# Patient Record
Sex: Female | Born: 1985 | Race: Asian | Hispanic: No | Marital: Single | Smoking: Never smoker
Health system: Southern US, Community
[De-identification: ages and names within clinical notes are randomized; demographics above are authoritative.]

## PROBLEM LIST (undated history)

## (undated) ENCOUNTER — Inpatient Hospital Stay (HOSPITAL_COMMUNITY): Payer: Self-pay

## (undated) DIAGNOSIS — O24419 Gestational diabetes mellitus in pregnancy, unspecified control: Secondary | ICD-10-CM

## (undated) HISTORY — PX: TONSILLECTOMY: SUR1361

---

## 2007-04-06 ENCOUNTER — Ambulatory Visit: Payer: Self-pay | Admitting: Nurse Practitioner

## 2007-04-06 DIAGNOSIS — J029 Acute pharyngitis, unspecified: Secondary | ICD-10-CM

## 2007-04-06 DIAGNOSIS — L259 Unspecified contact dermatitis, unspecified cause: Secondary | ICD-10-CM

## 2007-04-07 ENCOUNTER — Emergency Department (HOSPITAL_COMMUNITY): Admission: EM | Admit: 2007-04-07 | Discharge: 2007-04-07 | Payer: Self-pay | Admitting: Emergency Medicine

## 2007-04-07 ENCOUNTER — Encounter (INDEPENDENT_AMBULATORY_CARE_PROVIDER_SITE_OTHER): Payer: Self-pay | Admitting: Nurse Practitioner

## 2007-05-25 ENCOUNTER — Ambulatory Visit (HOSPITAL_BASED_OUTPATIENT_CLINIC_OR_DEPARTMENT_OTHER): Admission: RE | Admit: 2007-05-25 | Discharge: 2007-05-26 | Payer: Self-pay | Admitting: Otolaryngology

## 2007-05-25 ENCOUNTER — Encounter (INDEPENDENT_AMBULATORY_CARE_PROVIDER_SITE_OTHER): Payer: Self-pay | Admitting: Otolaryngology

## 2007-05-29 ENCOUNTER — Emergency Department (HOSPITAL_COMMUNITY): Admission: EM | Admit: 2007-05-29 | Discharge: 2007-05-29 | Payer: Self-pay | Admitting: Emergency Medicine

## 2010-03-23 ENCOUNTER — Ambulatory Visit
Admission: RE | Admit: 2010-03-23 | Discharge: 2010-03-23 | Payer: Self-pay | Source: Home / Self Care | Attending: Nurse Practitioner | Admitting: Nurse Practitioner

## 2010-03-23 ENCOUNTER — Encounter (INDEPENDENT_AMBULATORY_CARE_PROVIDER_SITE_OTHER): Payer: Self-pay | Admitting: Nurse Practitioner

## 2010-03-23 DIAGNOSIS — K219 Gastro-esophageal reflux disease without esophagitis: Secondary | ICD-10-CM | POA: Insufficient documentation

## 2010-03-23 DIAGNOSIS — L738 Other specified follicular disorders: Secondary | ICD-10-CM | POA: Insufficient documentation

## 2010-03-26 ENCOUNTER — Telehealth (INDEPENDENT_AMBULATORY_CARE_PROVIDER_SITE_OTHER): Payer: Self-pay | Admitting: Nurse Practitioner

## 2010-03-26 DIAGNOSIS — A048 Other specified bacterial intestinal infections: Secondary | ICD-10-CM | POA: Insufficient documentation

## 2010-03-26 LAB — CONVERTED CEMR LAB
AST: 24 units/L (ref 0–37)
Albumin: 4.9 g/dL (ref 3.5–5.2)
Alkaline Phosphatase: 58 units/L (ref 39–117)
Basophils Relative: 1 % (ref 0–1)
Eosinophils Absolute: 0.1 10*3/uL (ref 0.0–0.7)
Lymphs Abs: 1.4 10*3/uL (ref 0.7–4.0)
MCV: 88.9 fL (ref 78.0–100.0)
Neutro Abs: 2.7 10*3/uL (ref 1.7–7.7)
Neutrophils Relative %: 55 % (ref 43–77)
Platelets: 221 10*3/uL (ref 150–400)
Potassium: 4.8 meq/L (ref 3.5–5.3)
Sodium: 138 meq/L (ref 135–145)
Total Protein: 8.5 g/dL — ABNORMAL HIGH (ref 6.0–8.3)
WBC: 4.9 10*3/uL (ref 4.0–10.5)

## 2010-03-28 ENCOUNTER — Ambulatory Visit
Admission: RE | Admit: 2010-03-28 | Discharge: 2010-03-28 | Payer: Self-pay | Source: Home / Self Care | Attending: Nurse Practitioner | Admitting: Nurse Practitioner

## 2010-03-28 ENCOUNTER — Encounter (INDEPENDENT_AMBULATORY_CARE_PROVIDER_SITE_OTHER): Payer: Self-pay | Admitting: Nurse Practitioner

## 2010-04-03 ENCOUNTER — Telehealth (INDEPENDENT_AMBULATORY_CARE_PROVIDER_SITE_OTHER): Payer: Self-pay | Admitting: Nurse Practitioner

## 2010-04-05 NOTE — Progress Notes (Signed)
Summary: Medication concerns  Phone Note Call from Patient   Summary of Call:  PT SAYS SHE IS UNABLE TO KEEP THE MED  DOWN. EITHER RANITIDINE AND CEPHALEXIN.. SHE SAYS EVERY TIME SHE TAKES THE MED IT STOPS IN HER MIDDLE OF HER  BACK AND COMES BACK UP... PT SAYS SHE HAVE A WHITE  MUCUS THAT STOPS IN HER THROAT THAT MAKES HER VOMIT AND ITCHES HER THROAT.... PT SAYS EVERYTHING THAT SHE EATS DON'T GO TO HER STOMACH THAT IT JUST STOPS IN HER MIDDLE OF HER CHEST AND COMES BACK UP ABOUT FIVE OR TEN MINUTES... Initial call taken by: Armenia Shannon,  March 26, 2010 11:51 AM  Follow-up for Phone Call        schedule pt a f/u visit with provider to discuss labs done on last week.  She still needs to take meds as ordered on last week.  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

## 2010-04-05 NOTE — Assessment & Plan Note (Signed)
Summary: Acid Reflux   Vital Signs:  Patient profile:   25 year old female Weight:      97.5 pounds BMI:     19.76 Temp:     98.2 degrees F oral Pulse rate:   80 / minute Pulse rhythm:   regular Resp:     20 per minute BP sitting:   90 / 60  (left arm) Cuff size:   regular  Vitals Entered By: Levon Hedger (March 23, 2010 11:24 AM) CC: problems with digesting food...after eating at night she has alot of vomiting and discomfort x 1 week cause her heart to flutter...rash on whole body with itching , Abdominal Pain Is Patient Diabetic? No Pain Assessment Patient in pain? yes     Location: chest  Does patient need assistance? Functional Status Self care Ambulation Normal   CC:  problems with digesting food...after eating at night she has alot of vomiting and discomfort x 1 week cause her heart to flutter...rash on whole body with itching  and Abdominal Pain.  History of Present Illness:  Pt into the office with several complaints.  she has not been seen in this office since 2009  Abdominal problems -  Eating foods make problem worse +vomiting +burning midsternal - mostly present at night when she goes to bed -tea in the past 2 weeks -ETOH -stopped eating spicy foods 2 weeks ago +spagetti Reports that she took some of her family members medications without resolution of the symptoms  Rash - pt reports she was seen at the [address] 2 months ago for rash. She was prescribed 2 pills of which pt reports she only took one and it made her sick so she did not take the other. Rash present since 2010 - intermittent; chest, upper back and sometimes face affected +itching +"pimples" from time to  She has changed her soap about 1 month ago. Prior to that she was using [employer] and she did not notice any resolution of symptoms.  Social - pt is going to start working at [employer] on next week. Immunizations updated there 3 days ago. Pt speaks English  Dyspepsia  History:      The patient notes that the symptoms began approximately 03/05/2010.  She has no alarm features of dyspepsia including no history of melena, hematochezia, dysphagia, persistent vomiting, or involuntary weight loss > 5%.  There is no prior history of GERD.  The patient does not have a prior history of documented ulcer disease.  The dominant symptom is not heartburn or acid reflux.  An H-2 blocker medication is currently being taken.  No previous upper endoscopy has been done.    Allergies (verified): No Known Drug Allergies  Review of Systems General:  Denies fever. CV:  Denies chest pain or discomfort. Resp:  Denies cough. GI:  Complains of indigestion. Derm:  Complains of lesion(s).  Physical Exam  General:  alert.   Head:  normocephalic.   Msk:  normal ROM.   Neurologic:  alert & oriented X3.   Skin:  upper chest and back with papular lesion some erythematous and some healed Psych:  Oriented X3.     Impression & Recommendations:  Problem # 1:  ACID REFLUX DISEASE (ICD-530.81)  handout given with foods to monitor advised pt of dx Her updated medication list for this problem includes:    Ranitidine Hcl 150 Mg Tabs (Ranitidine hcl) ..... One tablet by mouth before breakfast and one tablet by mouth before dinner  Orders:  T-H Pylori Antibody IgM [phone]) T-Comprehensive Metabolic Panel [phone]) T-CBC w/Diff ([id number]) T-TSH ([id number])  Problem # 2:  FOLLICULITIS (ICD-704.8) advised pt of the dx handout given pt to use the three times per week to decrease symptoms  Complete Medication List: 1)  Ranitidine Hcl 150 Mg Tabs (Ranitidine hcl) .... One tablet by mouth before breakfast and one tablet by mouth before dinner 2)  Cephalexin 500 Mg Caps (Cephalexin) .... One capsule by mouth three times a day for infection 3)  Betasept Surgical Scrub 4 % Liqd (Chlorhexidine gluconate) .... Apply to affected area three times per week, let sit for 5 minutes  then rinse   Patient Instructions: 1)  Acid reflux - read handout to determine foods to avoid 2)  Take the medicine by mouth BEFORE breakfast and before dinner 3)  Skin problems - read the handout (Folliculitis) 4)  take the cephalexin 500mg  by mouth three times a day  5)  Use the wash and apply to chest and back three times per week. Let sit for 5 minutes then rinse  6)  Follow up as needed Prescriptions: BETASEPT SURGICAL SCRUB 4 % LIQD (CHLORHEXIDINE GLUCONATE) Apply to affected area three times per week, let sit for 5 minutes then rinse  #29ml x 0   Entered and Authorized by:   Lehman Prom FNP   Signed by:   Lehman Prom FNP on 03/23/2010   Method used:   Print then Give to Patient   RxID:   [id number] CEPHALEXIN 500 MG CAPS (CEPHALEXIN) One capsule by mouth three times a day for infection  #30 x 0   Entered and Authorized by:   Lehman Prom FNP   Signed by:   Lehman Prom FNP on 03/23/2010   Method used:   Print then Give to Patient   RxID:   [id number] RANITIDINE HCL 150 MG TABS (RANITIDINE HCL) One tablet by mouth before breakfast and one tablet by mouth before dinner  #60 x 3   Entered and Authorized by:   Lehman Prom FNP   Signed by:   Lehman Prom FNP on 03/23/2010   Method used:   Print then Give to Patient   RxID:   [id number]    Orders Added: 1)  Est. Patient Level III [06301] 2)  T-H Pylori Antibody IgM [60109-32355] 3)  T-Comprehensive Metabolic Panel [80053-22900] 4)  T-CBC w/Diff [[id number]] 5)  T-TSH [06237-62831]

## 2010-04-05 NOTE — Assessment & Plan Note (Signed)
Summary: Review labs   Vital Signs:  Patient profile:   25 year old female Weight:      97.0 pounds BMI:     19.66 Temp:     97.8 degrees F oral Pulse rate:   72 / minute Pulse rhythm:   regular Resp:     16 per minute BP sitting:   90 / 70  (left arm) Cuff size:   regular  Vitals Entered By: Levon Hedger (March 28, 2010 3:06 PM) CC: discuss medicine...discuss lab results. Is Patient Diabetic? No  Does patient need assistance? Functional Status Self care Ambulation Normal   CC:  discuss medicine...discuss lab results..  History of Present Illness:  Pt into the office for f/u on medications and labs. Pt was started on rantidine and keflex on last visit. Pt was not able to tolerate the keflex and so she stopped it. She has continued to take the acid reflux medication. Pt had difficulty swallowing the antibiotic pills so requests liquid with any further antibiotics  Labs indicate that pt has H.Pylori.  She does report intermittent problems with her stomach over the past 2 - 3 years.  Allergies (verified): No Known Drug Allergies  Review of Systems CV:  Denies chest pain or discomfort. Resp:  Denies cough. GI:  Complains of abdominal pain, indigestion, nausea, and vomiting; started once she started taking that antibiotic. MS:  Complains of cramps.  Physical Exam  General:  alert.   Head:  normocephalic.   Msk:  normal ROM.   Neurologic:  alert & oriented X3.     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 One tablet by mouth before breakfast and one tablet by mouth before dinner 2)  Betasept Surgical Scrub 4 % Liqd (Chlorhexidine gluconate) .... Apply to  affected area three times per week, let sit for 5 minutes then rinse 3)  Protonix 40 Mg Tbec (Pantoprazole sodium) .... One tablet by mouth two times a day for stomach 4)  Clarithromycin 250 Mg/16ml Susr (Clarithromycin) .... 10mg  by mouth two times a day for h. pylori 5)  Amoxicillin 250 Mg/46ml Susr (Amoxicillin) .... 4 teaspoons by mouth two times a day for infection  Patient Instructions: 1)  You have an infection in your stomach. 2)  Take all the medicatoins for 10 days.  I will send prescription for liquid medication to the pharmacy. 3)  You should get [facility] from [facility] so the cost will be lower. 4)  You can get burgers with cheese but avoid tomatos and onions. 5)  You can eat bananas. 6)  Avoid orange juice because it irriates your stomach. 7)  You can take a multivitamin by mouth daily (you can buy this over the counter) Prescriptions: AMOXICILLIN 250 MG/5ML SUSR (AMOXICILLIN) 4 teaspoons by mouth two times a day for infection  #512ml x 0   Entered and Authorized by:   Lehman Prom FNP   Signed by:   Lehman Prom FNP on 03/28/2010   Method used:   Faxed to ...       [facility] (retail)       [address] [place],  [place]  [address]       Ph: [phone]       Fax: [phone]   RxID:   [id number] CLARITHROMYCIN 250 MG/5ML SUSR (CLARITHROMYCIN) 10mg  by mouth two times a day for H. pylori  #317ml x 0   Entered and Authorized by:   Lehman Prom FNP   Signed by:   Lehman Prom FNP on 03/28/2010   Method used:   Faxed to ...       [facility] (retail)       [address] [place], [place]  [address]       Ph: [phone]       Fax: [phone]   RxID:   [id number] PROTONIX 40 MG TBEC (PANTOPRAZOLE SODIUM) One tablet by mouth two times a day for stomach  #28 x 0   Entered and Authorized by:   Lehman Prom FNP   Signed by:   Lehman Prom FNP on  03/28/2010   Method used:   Faxed to ...       [facility] (retail)       [address] [place], [place]  [address]       Ph: [phone]       Fax: [phone]   RxID:   [id number]    Orders Added: 1)  Est. Patient Level III [phone]

## 2010-04-05 NOTE — Letter (Signed)
Summary: Handout Printed  Printed Handout:  - Helicobacter Pylori Disease

## 2010-04-05 NOTE — Letter (Signed)
Summary: Handout Printed  Printed Handout:  - Folliculitis

## 2010-04-11 NOTE — Progress Notes (Signed)
Summary: Meds  Phone Note Call from Patient   Summary of Call: pt says she went to [facility] and the med (clarithromycin) is too high..... pt would like to go to [facility] and would like to get tablets Initial call taken by: [place] Shannon,  April 03, 2010 3:17 PM  Follow-up for Phone Call        I'm confused - I sent the Rx to [facility] pharmacy She has H.Pylori.  I have tried her on pills and she is not able to swallow Call [place] - find out what happened with  the meds.  If not, available could the oral suspension not be ordered Pt is NOT able to afford anywhere else that is why she has an orange card and was sent to [facility] pharmacy Follow-up by: Lehman Prom FNP,  April 04, 2010 1:23 PM  Additional Follow-up for Phone Call Additional follow up Details #1::        pt into the office today. She has agreed to take the pill form of the medication since she has not been able to afford the oral  - clarithromycin. Additional Follow-up by: Lehman Prom FNP,  April 04, 2010 3:39 PM    New/Updated Medications: CLARITHROMYCIN 500 MG TABS (CLARITHROMYCIN) One tablet by mouth two times a day for infection Prescriptions: CLARITHROMYCIN 500 MG TABS (CLARITHROMYCIN) One tablet by mouth two times a day for infection  #28 x 0   Entered and Authorized by:   Lehman Prom FNP   Signed by:   Lehman Prom FNP on 04/04/2010   Method used:   Faxed to ...       [facility] (retail)       [address] [place], [place]  [address]       Ph: [phone]       Fax: [phone]   RxID:   [id number]

## 2010-05-15 ENCOUNTER — Encounter: Payer: Self-pay | Admitting: Nurse Practitioner

## 2010-05-15 ENCOUNTER — Encounter (INDEPENDENT_AMBULATORY_CARE_PROVIDER_SITE_OTHER): Payer: Self-pay | Admitting: Nurse Practitioner

## 2010-05-15 DIAGNOSIS — K5900 Constipation, unspecified: Secondary | ICD-10-CM | POA: Insufficient documentation

## 2010-05-15 DIAGNOSIS — K649 Unspecified hemorrhoids: Secondary | ICD-10-CM | POA: Insufficient documentation

## 2010-05-22 NOTE — Letter (Signed)
Summary: Handout Printed  Printed Handout:  - Diet - High-Fiber 

## 2010-05-22 NOTE — Assessment & Plan Note (Signed)
Summary: Constipation   Vital Signs:  Patient profile:   25 year old female LMP:     05/06/2010 Weight:      99.5 pounds BMI:     20.17 Temp:     98.0 degrees F oral Pulse rate:   90 / minute Pulse rhythm:   regular Resp:     20 per minute BP sitting:   100 / 68  (left arm) Cuff size:   regular  Vitals Entered By: Levon Hedger (May 15, 2010 12:38 PM) CC: still having trouble with stomach she says that she has been out of stomach medicine for a month....constipation, Abdominal Pain Is Patient Diabetic? No Pain Assessment Patient in pain? yes     Location: stomach  Does patient need assistance? Functional Status Self care Ambulation Normal LMP (date): 05/06/2010     Enter LMP: 05/06/2010   CC:  still having trouble with stomach she says that she has been out of stomach medicine for a month....constipation and Abdominal Pain.  History of Present Illness:  Pt into the office for constipation Pt admits that she is not eating many high fiber foods  Pt completed her meds for H.Pylori as ordered.    Dyspepsia History:      She has no alarm features of dyspepsia including no history of melena, hematochezia, dysphagia, persistent vomiting, or involuntary weight loss > 5%.  There is a prior history of GERD.  The patient does not have a prior history of documented ulcer disease.  The dominant symptom is heartburn or acid reflux.  An H-2 blocker medication is currently being taken.  She notes that the symptoms have improved with the H-2 blocker therapy.  Symptoms have not persisted after 4 weeks of H-2 blocker treatment.  She has a history of a positive H. Pylori serology.  No previous upper endoscopy has been done.     Allergies (verified): No Known Drug Allergies  Review of Systems General:  Denies loss of appetite. CV:  Denies fatigue. Resp:  Denies cough. GI:  Complains of abdominal pain, bloody stools, constipation, hemorrhoids, and indigestion; +menstrual cramps  now. MS:  +pain present between shoulder blades.  Physical Exam  General:  alert.   Head:  normocephalic.   Rectal:  defer due to menses on currently Msk:  normal ROM.   Neurologic:  alert & oriented X3.   Skin:  color normal.   Psych:  Oriented X3.     Impression & Recommendations:  Problem # 1:  CONSTIPATION (ICD-564.00) handout given pt to start a stool softner and high fiber diet  Problem # 2:  HEMORRHOIDS (ICD-455.6) handout given  pt to get TUCKS OTC  Problem # 3:  ACID REFLUX DISEASE (ICD-530.81) will refill meds The following medications were removed from the medication list:    Protonix 40 Mg Tbec (Pantoprazole sodium) ..... One tablet by mouth two times a day for stomach Her updated medication list for this problem includes:    Ranitidine Hcl 150 Mg Tabs (Ranitidine hcl) ..... One tablet by mouth before breakfast and one tablet by mouth before dinner if abdominal problems persist will consider getting abdominal u/s  Complete Medication List: 1)  Ranitidine Hcl 150 Mg Tabs (Ranitidine hcl) .... One tablet by mouth before breakfast and one tablet by mouth before dinner 2)  Betasept Surgical Scrub 4 % Liqd (Chlorhexidine gluconate) .... Apply to affected area three times per week, let sit for 5 minutes then rinse  Dyspepsia Assessment/Plan:  Step Therapy: GERD  Treatment Protocols:    Step-1: started    H-2 blocker chosen: Ranitidine 150mg  by mouth at bedtime  Patient Instructions: 1)  Constipation - you need to be sure to keep your stools soft. 2)  you should eat raisins, oatmeal, raisin bran, apples with peeling. 3)  You can buy senokot over the counter and take daily to help soften stools 4)  Hemmorrhoids - this is what makes you bleed.  You should get TUCKS pads over the counter to help with bleeding and pain. 5)  Follow up as needed or if problem worsens. will also need rectal exam is problem persists Prescriptions: RANITIDINE HCL 150 MG TABS (RANITIDINE  HCL) One tablet by mouth before breakfast and one tablet by mouth before dinner  #60 x 11   Entered and Authorized by:   Lehman Prom FNP   Signed by:   Lehman Prom FNP on 05/15/2010   Method used:   Print then Give to Patient   RxID:   [id number]    Orders Added: 1)  Est. Patient Level III [95188]

## 2010-05-22 NOTE — Letter (Signed)
Summary: Handout Printed  Printed Handout:  - Hemorrhoids 

## 2010-07-17 NOTE — Op Note (Signed)
[name], [name]                 ACCOUNT NO.:  [id number]   MEDICAL RECORD NO.:  [id number]          PATIENT TYPE:  AMB   LOCATION:  DSC                          FACILITY:  [facility]   PHYSICIAN:  Karol T. Lazarus Salines, M.D. DATE OF BIRTH:  [birth year]   DATE OF PROCEDURE:  05/25/2007  DATE OF DISCHARGE:                               OPERATIVE REPORT   PREOPERATIVE DIAGNOSIS:  Recurrent tonsillitis   POSTOPERATIVE DIAGNOSIS:  Recurrent tonsillitis   PROCEDURE PERFORMED:  Tonsillectomy, adenoid ablation.   SURGEON:  Gloris Manchester. Lazarus Salines, M.D.   ANESTHESIA:  General orotracheal.   BLOOD LOSS:  Minimal.   COMPLICATIONS:  None.   FINDINGS:  2+ protruding tonsils.  Normal soft palate.  25% adenoid pad.  Clear anterior nose.   PROCEDURE:  With the patient in a comfortable supine position, general  orotracheal anesthesia was induced without difficulty.  At an  appropriate level, the table was turned 90 degrees and the patient  placed in Trendelenburg.  A clean preparation and draping was  accomplished.  Taking care to protect lips, teeth, and endotracheal  tube, the Crowe-Davis mouth gag was introduced, expanded for  visualization, and suspended from the Mayo stand in the standard  fashion.  The findings were as described above.  Palate retractor and  mirror were used to visualize the nasopharynx with the findings as  described above.  The anterior nose was inspected with the nasal  speculum with the findings as described above.  One-half percent  Xylocaine with 1:200,000 epinephrine, 8 mL total was infiltrated into  the peritonsillar planes for intraoperative hemostasis.  Several minutes  were allowed for this take effect.   A red rubber catheter was passed through the nose into the mouth and  brought out through the mouth to serve as a palate retractor.  Using  suction cautery and indirect visualization, the adenoid pad was ablated.  Hemostasis was achieved.  No further attention to the  nasopharynx was  required.  The catheter was relaxed.   Beginning on the left side, the tonsil was grasped and retracted  medially.  The mucosa overlying the anterior and superior poles was  coagulated and then cut down to the capsule of the tonsil.  Using the  cautery tip as a blunt dissector, lysing fibrous bands, and coagulating  crossing vessels as identified, the tonsil was dissected from its  muscular fossa from superiorly downward.  The tonsil was removed in its  entirety as determined by examination of both tonsil and fossa.  A small  additional quantity of cautery rendered the fossa hemostatic.  After  completing the left tonsillectomy, the right side was done in identical  fashion.   After removing both tonsils and rendering the oropharynx hemostatic, the  nasopharynx was inspected and again observed to be hemostatic.  At this  point the palate retractor and mouth gag were relaxed for several  minutes.  Upon re-expansion, hemostasis was persistent.  At this point  the procedure was completed.  The palate retractor and mouth gag were  relaxed and removed.  The dental status was intact.  The patient was  returned to Anesthesia, awakened, extubated, and transferred to recovery  in stable condition.   COMMENT:  A 25 year old [place] female immigrant with a history of  recurrent sore throats was the indication for today's procedure.  Anticipate routine postoperative recovery with attention to analgesia,  antibiosis, hydration, and observation for bleeding, airway compromise  or emesis.  Given low anticipated risk of postanesthetic or postsurgical  complications, will observe 6 hours and then she can be discharged home  if she is doing well.      Gloris Manchester. Lazarus Salines, M.D.  Electronically Signed     KTW/MEDQ  D:  05/25/2007  T:  05/25/2007  Job:  [id number]

## 2010-09-26 ENCOUNTER — Other Ambulatory Visit (HOSPITAL_COMMUNITY): Payer: Self-pay | Admitting: Family Medicine

## 2010-09-27 ENCOUNTER — Ambulatory Visit (HOSPITAL_COMMUNITY)
Admission: RE | Admit: 2010-09-27 | Discharge: 2010-09-27 | Disposition: A | Discharge status: Home / Self Care | Payer: Self-pay | Source: Ambulatory Visit | Attending: Family Medicine | Admitting: Family Medicine

## 2010-09-27 DIAGNOSIS — R51 Headache: Secondary | ICD-10-CM | POA: Insufficient documentation

## 2010-09-27 MED ORDER — IOHEXOL 300 MG/ML  SOLN
100.0000 mL | Freq: Once | INTRAMUSCULAR | Status: AC | PRN
  Administered 2010-09-27: 100 mL via INTRAVENOUS

## 2010-11-23 ENCOUNTER — Inpatient Hospital Stay (INDEPENDENT_AMBULATORY_CARE_PROVIDER_SITE_OTHER)
Admission: RE | Admit: 2010-11-23 | Discharge: 2010-11-23 | Disposition: A | Discharge status: Home / Self Care | Payer: Self-pay | Source: Ambulatory Visit | Attending: Emergency Medicine | Admitting: Emergency Medicine

## 2010-11-23 DIAGNOSIS — R300 Dysuria: Secondary | ICD-10-CM

## 2010-11-23 DIAGNOSIS — M545 Low back pain: Secondary | ICD-10-CM

## 2010-11-23 DIAGNOSIS — N912 Amenorrhea, unspecified: Secondary | ICD-10-CM

## 2010-11-23 LAB — WET PREP, GENITAL

## 2010-11-23 LAB — POCT URINALYSIS DIP (DEVICE)
Protein, ur: NEGATIVE mg/dL
Specific Gravity, Urine: 1.015 (ref 1.005–1.030)
Urobilinogen, UA: 0.2 mg/dL (ref 0.0–1.0)
pH: 6.5 (ref 5.0–8.0)

## 2010-11-24 LAB — GC/CHLAMYDIA PROBE AMP, GENITAL: GC Probe Amp, Genital: NEGATIVE

## 2010-11-26 LAB — POCT I-STAT, CHEM 8
BUN: 9
Calcium, Ion: 1.1 — ABNORMAL LOW
Chloride: 101
Creatinine, Ser: 0.7
Glucose, Bld: 88
HCT: 42
Hemoglobin: 14.3
Potassium: 4.1
Sodium: 135
TCO2: 26

## 2010-11-26 LAB — CBC
MCV: 89.5
Platelets: 226
WBC: 9.5

## 2010-11-26 LAB — DIFFERENTIAL
Eosinophils Absolute: 0.1
Lymphocytes Relative: 10 — ABNORMAL LOW
Lymphs Abs: 0.9
Neutrophils Relative %: 84 — ABNORMAL HIGH

## 2010-11-26 LAB — POCT HEMOGLOBIN-HEMACUE: Hemoglobin: 16 — ABNORMAL HIGH

## 2010-12-20 ENCOUNTER — Encounter (HOSPITAL_COMMUNITY): Payer: Self-pay | Admitting: *Deleted

## 2010-12-20 ENCOUNTER — Inpatient Hospital Stay (HOSPITAL_COMMUNITY)
Admission: AD | Admit: 2010-12-20 | Discharge: 2010-12-20 | Disposition: A | Discharge status: Home / Self Care | Payer: Self-pay | Source: Ambulatory Visit | Attending: Family Medicine | Admitting: Family Medicine

## 2010-12-20 DIAGNOSIS — N949 Unspecified condition associated with female genital organs and menstrual cycle: Secondary | ICD-10-CM

## 2010-12-20 DIAGNOSIS — N912 Amenorrhea, unspecified: Secondary | ICD-10-CM | POA: Insufficient documentation

## 2010-12-20 DIAGNOSIS — N910 Primary amenorrhea: Secondary | ICD-10-CM

## 2010-12-20 LAB — URINALYSIS, ROUTINE W REFLEX MICROSCOPIC
Hgb urine dipstick: NEGATIVE
Leukocytes, UA: NEGATIVE
Nitrite: NEGATIVE
Specific Gravity, Urine: >1.03 — ABNORMAL HIGH (ref 1.005–1.030)
Urobilinogen, UA: 0.2 mg/dL (ref 0.0–1.0)

## 2010-12-20 LAB — POCT PREGNANCY, URINE: Preg Test, Ur: NEGATIVE

## 2010-12-20 MED ORDER — MEDROXYPROGESTERONE ACETATE 10 MG PO TABS: 10.0000 mg | ORAL_TABLET | Freq: Every day | ORAL | Status: DC

## 2010-12-20 NOTE — ED Provider Notes (Signed)
History   Pt presents today c/o no menses for the past 3 months. She denies abd pain, vag dc, bleeding, fever or any other problems at this time.   Chief Complaint  Patient presents with  . Possible Pregnancy   HPI  OB History    Grav Para Term Preterm Abortions TAB SAB Ect Mult Living   1    1  1          Past Medical History  Diagnosis Date  . Migraine     Past Surgical History  Procedure Date  . Tonsillectomy     No family history on file.  History  Substance Use Topics  . Smoking status: Never Smoker   . Smokeless tobacco: Not on file  . Alcohol Use: No    Allergies: No Known Allergies  Prescriptions prior to admission  Medication Sig Dispense Refill  . ibuprofen (ADVIL,MOTRIN) 200 MG tablet Take 200 mg by mouth daily as needed. For pain         Review of Systems  Constitutional: Negative for fever.  Cardiovascular: Negative for chest pain.  Gastrointestinal: Negative for nausea, vomiting, abdominal pain, diarrhea and constipation.  Genitourinary: Negative for dysuria, urgency, frequency and hematuria.  Neurological: Negative for dizziness and headaches.  Psychiatric/Behavioral: Negative for depression and suicidal ideas.   Physical Exam   Blood pressure 113/76, pulse 81, temperature 98.2 F (36.8 C), temperature source Oral, resp. rate 18, height 5\' 1"  (1.549 m), weight 107 lb (48.535 kg).  Physical Exam  Nursing note and vitals reviewed. Constitutional: She is oriented to person, place, and time. She appears well-developed and well-nourished. No distress.  HENT:  Head: Normocephalic and atraumatic.  Eyes: EOM are normal. Pupils are equal, round, and reactive to light.  GI: Soft. She exhibits no distension and no mass. There is no tenderness. There is no rebound and no guarding.  Genitourinary: No bleeding around the vagina. No vaginal discharge found.       Cervix Lg/closed. Uterus NL size and shape. No adnexal masses.  Neurological: She is alert  and oriented to person, place, and time.  Skin: Skin is warm and dry. She is not diaphoretic.  Psychiatric: She has a normal mood and affect. Her behavior is normal. Judgment and thought content normal.    MAU Course  Procedures  Results for orders placed during the hospital encounter of 12/20/10 (from the past 24 hour(s))  URINALYSIS, ROUTINE W REFLEX MICROSCOPIC     Status: Abnormal   Collection Time   12/20/10 11:05 AM      Component Value Range   Color, Urine YELLOW  YELLOW    Appearance CLEAR  CLEAR    Specific Gravity, Urine >1.030 (*) 1.005 - 1.030    pH 5.5  5.0 - 8.0    Glucose, UA NEGATIVE  NEGATIVE (mg/dL)   Hgb urine dipstick NEGATIVE  NEGATIVE    Bilirubin Urine NEGATIVE  NEGATIVE    Ketones, ur NEGATIVE  NEGATIVE (mg/dL)   Protein, ur NEGATIVE  NEGATIVE (mg/dL)   Urobilinogen, UA 0.2  0.0 - 1.0 (mg/dL)   Nitrite NEGATIVE  NEGATIVE    Leukocytes, UA NEGATIVE  NEGATIVE   POCT PREGNANCY, URINE     Status: Normal   Collection Time   12/20/10 11:21 AM      Component Value Range   Preg Test, Ur NEGATIVE       Assessment and Plan  Delayed menses: will give Rx for provera. She will f/u with her PCP.  Discussed diet, activity, risks, and precautions.  Clinton Gallant. Maekayla Giorgio III, DrHSc, MPAS, PA-C  12/20/2010, 11:48 AM   Henrietta Hoover, PA 12/20/10 1151

## 2010-12-20 NOTE — ED Notes (Signed)
Pt has not had a period in 3 months; hx of irregular period; denies any pain; UPT negative;

## 2010-12-20 NOTE — Progress Notes (Signed)
No period  For 3 months.  Went to urgent care in Sept (21)- was told not preg.  Was told to go to [facility]- has appt  For 10/27.

## 2010-12-20 NOTE — ED Provider Notes (Signed)
Chart reviewed and agree with management and plan.  

## 2011-01-16 ENCOUNTER — Encounter: Payer: Self-pay | Admitting: Obstetrics and Gynecology

## 2011-09-09 ENCOUNTER — Emergency Department (HOSPITAL_COMMUNITY)
Admission: EM | Admit: 2011-09-09 | Discharge: 2011-09-09 | Disposition: A | Discharge status: Home / Self Care | Payer: 59 | Source: Home / Self Care | Attending: Family Medicine | Admitting: Family Medicine

## 2011-09-09 ENCOUNTER — Encounter (HOSPITAL_COMMUNITY): Payer: Self-pay

## 2011-09-09 ENCOUNTER — Emergency Department (INDEPENDENT_AMBULATORY_CARE_PROVIDER_SITE_OTHER): Payer: 59

## 2011-09-09 DIAGNOSIS — M79609 Pain in unspecified limb: Secondary | ICD-10-CM

## 2011-09-09 DIAGNOSIS — M79673 Pain in unspecified foot: Secondary | ICD-10-CM

## 2011-09-09 LAB — POCT URINALYSIS DIP (DEVICE)
Glucose, UA: NEGATIVE mg/dL
Leukocytes, UA: NEGATIVE
Nitrite: NEGATIVE
Specific Gravity, Urine: 1.02 (ref 1.005–1.030)
Urobilinogen, UA: 1 mg/dL (ref 0.0–1.0)

## 2011-09-09 LAB — POCT PREGNANCY, URINE: Preg Test, Ur: NEGATIVE

## 2011-09-09 NOTE — ED Provider Notes (Signed)
History     CSN: [id number]  Arrival date & time 09/09/11  1459   None     Chief Complaint  Patient presents with  . Foot Pain    (Consider location/radiation/quality/duration/timing/severity/associated sxs/prior treatment) Patient is a 26 y.o. female presenting with foot injury. The history is provided by the patient. No language interpreter was used.  Foot Injury  The incident occurred 12 to 24 hours ago. The incident occurred at home. There was no injury mechanism. The pain is present in the left foot. The pain is at a severity of 4/10. The pain is moderate. She reports no foreign bodies present. She has tried nothing for the symptoms.  Pt complains that she stepped on something. Pt thinks she has a foreign body in her foot.  Pt also reports she has burning when he urinates  Past Medical History  Diagnosis Date  . Migraine     Past Surgical History  Procedure Date  . Tonsillectomy     History reviewed. No pertinent family history.  History  Substance Use Topics  . Smoking status: Never Smoker   . Smokeless tobacco: Not on file  . Alcohol Use: No    OB History    Grav Para Term Preterm Abortions TAB SAB Ect Mult Living   1    1  1          Review of Systems  Skin: Negative for wound.  All other systems reviewed and are negative.    Allergies  Review of patient's allergies indicates no known allergies.  Home Medications   Current Outpatient Rx  Name Route Sig Dispense Refill  . IBUPROFEN 200 MG PO TABS Oral Take 200 mg by mouth daily as needed. For pain     . MEDROXYPROGESTERONE ACETATE 10 MG PO TABS Oral Take 1 tablet (10 mg total) by mouth daily. 10 tablet 0    BP 121/76  Pulse 85  Temp 98.6 F (37 C) (Oral)  Resp 16  SpO2 98%  Physical Exam  Nursing note and vitals reviewed. Constitutional: She appears well-developed and well-nourished.  HENT:  Head: Normocephalic and atraumatic.  Neck: Normal range of motion. Neck supple.  Cardiovascular:  Normal rate and regular rhythm.   Pulmonary/Chest: Effort normal and breath sounds normal.  Abdominal: Soft.  Musculoskeletal: She exhibits tenderness.  Neurological: She is alert.  Skin: Skin is warm.  Psychiatric: She has a normal mood and affect.    ED Course  Procedures (including critical care time)   Labs Reviewed  POCT URINALYSIS DIP (DEVICE)  POCT PREGNANCY, URINE   No results found.   No diagnosis found.    MDM  Urine is normal,  Xray no foreign body.   I will treat with ibuprofen,  I advised soak foot.  Follow up with Dr. Lajoyce Corners if foreign body senstaion persist.        Lonia Skinner [place], [place] 09/09/11 [phone]

## 2011-09-09 NOTE — ED Notes (Signed)
C/o 2 weeks pain w urination , denies vaginal d/c; also concern that she may have a FB in plantar surface of her foot, thinks she may ave stepped on something

## 2011-10-11 NOTE — ED Provider Notes (Signed)
Medical screening examination/treatment/procedure(s) were performed by resident physician or non-physician practitioner and as supervising physician I was immediately available for consultation/collaboration.   KINDL,JAMES DOUGLAS MD.    James D Kindl, MD 10/11/11 1600 

## 2012-01-17 ENCOUNTER — Emergency Department (HOSPITAL_COMMUNITY)
Admission: EM | Admit: 2012-01-17 | Discharge: 2012-01-17 | Disposition: A | Discharge status: Home / Self Care | Payer: 59 | Source: Home / Self Care | Attending: Family Medicine | Admitting: Family Medicine

## 2012-01-17 ENCOUNTER — Encounter (HOSPITAL_COMMUNITY): Payer: Self-pay

## 2012-01-17 DIAGNOSIS — J069 Acute upper respiratory infection, unspecified: Secondary | ICD-10-CM

## 2012-01-17 MED ORDER — SALINE NASAL SPRAY 0.65 % NA SOLN: 1.0000 | Freq: Three times a day (TID) | NASAL | Status: DC | PRN

## 2012-01-17 MED ORDER — FLUTICASONE PROPIONATE 50 MCG/ACT NA SUSP: 2.0000 | Freq: Every day | NASAL | Status: DC

## 2012-01-17 NOTE — ED Provider Notes (Signed)
History     CSN: [id number]  Arrival date & time 01/17/12  1409   First MD Initiated Contact with Patient 01/17/12 1700      Chief Complaint  Patient presents with  . Cough    (Consider location/radiation/quality/duration/timing/severity/associated sxs/prior treatment) Patient is a 26 y.o. female presenting with cough. The history is provided by the patient.  Cough This is a new problem. The current episode started yesterday. The problem occurs hourly. The cough is non-productive. There has been no fever. Associated symptoms include chest pain, chills, sweats, ear congestion, headaches, rhinorrhea, sore throat and myalgias. Treatments tried: tylenol and ibuprofen. The treatment provided no relief. She is not a smoker. Her past medical history does not include bronchitis, pneumonia or asthma.    Past Medical History  Diagnosis Date  . Migraine     Past Surgical History  Procedure Date  . Tonsillectomy     No family history on file.  History  Substance Use Topics  . Smoking status: Never Smoker   . Smokeless tobacco: Not on file  . Alcohol Use: No    OB History    Grav Para Term Preterm Abortions TAB SAB Ect Mult Living   1    1  1          Review of Systems  Constitutional: Positive for chills.  HENT: Positive for sore throat and rhinorrhea.   Respiratory: Positive for cough.   Cardiovascular: Positive for chest pain.  Musculoskeletal: Positive for myalgias.  Neurological: Positive for headaches.  All other systems reviewed and are negative.    Allergies  Review of patient's allergies indicates no known allergies.  Home Medications   Current Outpatient Rx  Name  Route  Sig  Dispense  Refill  . FLUTICASONE PROPIONATE 50 MCG/ACT NA SUSP   Nasal   Place 2 sprays into the nose daily.   16 g   2   . IBUPROFEN 200 MG PO TABS   Oral   Take 200 mg by mouth daily as needed. For pain          . MEDROXYPROGESTERONE ACETATE 10 MG PO TABS   Oral   Take 1  tablet (10 mg total) by mouth daily.   10 tablet   0   . SALINE NASAL SPRAY 0.65 % NA SOLN   Nasal   Place 1 spray into the nose 3 (three) times daily as needed for congestion.   30 mL   12     BP 118/82  Pulse 89  Temp 98.9 F (37.2 C) (Oral)  Resp 19  SpO2 100%  Physical Exam  Nursing note and vitals reviewed.   ED Course  Procedures (including critical care time)  Labs Reviewed - No data to display No results found.   1. URI (upper respiratory infection)       MDM  Increase fluid intake, rest.  No antibiotics indicated.  Begin expectorant/decongestant, topical decongestant, saline nasal spray and/or saline irrigation, and cough suppressant at bedtime. Antihistamines of your choice (Claritin or Zyrtec).  Tylenol or Motrin for fever/discomfort.  Followup with PCP if not improving 7 to 10 days.         Johnsie Kindred, NP 01/17/12 1725

## 2012-01-17 NOTE — ED Notes (Signed)
Patient states she has a fever (did not check but said she was warm last night) , dry cough and sore throat, states she has not tried any treatments

## 2012-01-17 NOTE — ED Provider Notes (Signed)
Medical screening examination/treatment/procedure(s) were performed by non-physician practitioner and as supervising physician I was immediately available for consultation/collaboration.  Leslee Home, M.D.   Reuben Likes, MD 01/17/12 2123

## 2012-08-25 ENCOUNTER — Other Ambulatory Visit: Payer: Self-pay | Admitting: Nurse Practitioner

## 2012-08-25 ENCOUNTER — Other Ambulatory Visit (HOSPITAL_COMMUNITY)
Admission: RE | Admit: 2012-08-25 | Discharge: 2012-08-25 | Disposition: A | Discharge status: Home / Self Care | Payer: 59 | Source: Ambulatory Visit | Attending: Obstetrics and Gynecology | Admitting: Obstetrics and Gynecology

## 2012-08-25 DIAGNOSIS — Z01419 Encounter for gynecological examination (general) (routine) without abnormal findings: Secondary | ICD-10-CM | POA: Insufficient documentation

## 2012-08-25 DIAGNOSIS — N760 Acute vaginitis: Secondary | ICD-10-CM | POA: Insufficient documentation

## 2012-08-25 DIAGNOSIS — Z113 Encounter for screening for infections with a predominantly sexual mode of transmission: Secondary | ICD-10-CM | POA: Insufficient documentation

## 2012-09-22 ENCOUNTER — Encounter (HOSPITAL_COMMUNITY): Payer: Self-pay

## 2012-09-22 ENCOUNTER — Emergency Department (HOSPITAL_COMMUNITY)
Admission: EM | Admit: 2012-09-22 | Discharge: 2012-09-23 | Disposition: A | Discharge status: Home / Self Care | Payer: 59 | Attending: Emergency Medicine | Admitting: Emergency Medicine

## 2012-09-22 DIAGNOSIS — K5289 Other specified noninfective gastroenteritis and colitis: Secondary | ICD-10-CM | POA: Insufficient documentation

## 2012-09-22 DIAGNOSIS — R509 Fever, unspecified: Secondary | ICD-10-CM | POA: Insufficient documentation

## 2012-09-22 DIAGNOSIS — R52 Pain, unspecified: Secondary | ICD-10-CM | POA: Insufficient documentation

## 2012-09-22 DIAGNOSIS — J029 Acute pharyngitis, unspecified: Secondary | ICD-10-CM | POA: Insufficient documentation

## 2012-09-22 DIAGNOSIS — K529 Noninfective gastroenteritis and colitis, unspecified: Secondary | ICD-10-CM

## 2012-09-22 DIAGNOSIS — Z8679 Personal history of other diseases of the circulatory system: Secondary | ICD-10-CM | POA: Insufficient documentation

## 2012-09-22 DIAGNOSIS — R51 Headache: Secondary | ICD-10-CM | POA: Insufficient documentation

## 2012-09-22 DIAGNOSIS — R112 Nausea with vomiting, unspecified: Secondary | ICD-10-CM | POA: Insufficient documentation

## 2012-09-22 DIAGNOSIS — R197 Diarrhea, unspecified: Secondary | ICD-10-CM | POA: Insufficient documentation

## 2012-09-22 NOTE — ED Notes (Signed)
Pt states she had a headache starting yesterday.  Today she has generalized body aches, sore throat, N/V/D.  Denies fever.

## 2012-09-23 LAB — CBC WITH DIFFERENTIAL/PLATELET
Eosinophils Relative: 1 % (ref 0–5)
Lymphocytes Relative: 14 % (ref 12–46)
Lymphs Abs: 1.2 10*3/uL (ref 0.7–4.0)
MCV: 86.7 fL (ref 78.0–100.0)
Neutro Abs: 7 10*3/uL (ref 1.7–7.7)
Neutrophils Relative %: 79 % — ABNORMAL HIGH (ref 43–77)
Platelets: 176 10*3/uL (ref 150–400)
RBC: 4.42 MIL/uL (ref 3.87–5.11)
WBC: 8.8 10*3/uL (ref 4.0–10.5)

## 2012-09-23 LAB — BASIC METABOLIC PANEL
CO2: 26 mEq/L (ref 19–32)
Chloride: 99 mEq/L (ref 96–112)
Glucose, Bld: 93 mg/dL (ref 70–99)
Potassium: 3.7 mEq/L (ref 3.5–5.1)
Sodium: 134 mEq/L — ABNORMAL LOW (ref 135–145)

## 2012-09-23 MED ORDER — LOPERAMIDE HCL 2 MG PO CAPS
4.0000 mg | ORAL_CAPSULE | Freq: Once | ORAL | Status: AC
  Administered 2012-09-23: 4 mg via ORAL
  Filled 2012-09-23: qty 2

## 2012-09-23 MED ORDER — KETOROLAC TROMETHAMINE 30 MG/ML IJ SOLN
30.0000 mg | Freq: Once | INTRAMUSCULAR | Status: AC
  Administered 2012-09-23: 30 mg via INTRAVENOUS
  Filled 2012-09-23: qty 1

## 2012-09-23 MED ORDER — DEXAMETHASONE SODIUM PHOSPHATE 10 MG/ML IJ SOLN
10.0000 mg | Freq: Once | INTRAMUSCULAR | Status: AC
  Administered 2012-09-23: 10 mg via INTRAVENOUS
  Filled 2012-09-23: qty 1

## 2012-09-23 MED ORDER — SODIUM CHLORIDE 0.9 % IV SOLN
1000.0000 mL | Freq: Once | INTRAVENOUS | Status: AC
  Administered 2012-09-23: 1000 mL via INTRAVENOUS

## 2012-09-23 MED ORDER — ONDANSETRON HCL 4 MG PO TABS: 4.0000 mg | ORAL_TABLET | Freq: Four times a day (QID) | ORAL | Status: DC | PRN

## 2012-09-23 MED ORDER — ONDANSETRON HCL 4 MG/2ML IJ SOLN
4.0000 mg | Freq: Once | INTRAMUSCULAR | Status: AC
  Administered 2012-09-23: 4 mg via INTRAVENOUS
  Filled 2012-09-23: qty 2

## 2012-09-23 MED ORDER — SODIUM CHLORIDE 0.9 % IV SOLN
1000.0000 mL | INTRAVENOUS | Status: DC
  Administered 2012-09-23: 1000 mL via INTRAVENOUS

## 2012-09-23 NOTE — ED Provider Notes (Signed)
History    CSN: [id number] Arrival date & time 09/22/12  2319  First MD Initiated Contact with Patient 09/23/12 0016     Chief Complaint  Patient presents with  . Generalized Body Aches  . Sore Throat  . Headache   (Consider location/radiation/quality/duration/timing/severity/associated sxs/prior Treatment) Patient is a 27 y.o. female presenting with pharyngitis and headaches. The history is provided by the patient.  Sore Throat Associated symptoms include headaches.  Headache She has had a mild headache for the last 3 days. Yesterday, she started developing a sore throat. Today, she has had nausea, vomiting, diarrhea. There's been subjective fever without chills or sweats. There's has been no arthralgias or myalgias. She's tried taking ibuprofen with no relief. She denies difficulty swallowing. She denies sick contacts. Symptoms are moderate to severe. Nothing makes it better nothing makes it worse. She rates her pain as moderate at 6/10. Past Medical History  Diagnosis Date  . Migraine    Past Surgical History  Procedure Laterality Date  . Tonsillectomy     No family history on file. History  Substance Use Topics  . Smoking status: Never Smoker   . Smokeless tobacco: Not on file  . Alcohol Use: No   OB History   Grav Para Term Preterm Abortions TAB SAB Ect Mult Living   1    1  1         Review of Systems  Neurological: Positive for headaches.  All other systems reviewed and are negative.    Allergies  Peanut-containing drug products  Home Medications   Current Outpatient Rx  Name  Route  Sig  Dispense  Refill  . PRESCRIPTION MEDICATION   Oral   Take 1 tablet by mouth every evening. Birth Control          BP 119/77  Pulse 99  Temp(Src) 98.5 F (36.9 C) (Oral)  Resp 18  SpO2 100%  LMP 08/29/2012 Physical Exam  Nursing note and vitals reviewed.  27 year old female, resting comfortably and in no acute distress. Vital signs are normal. Oxygen  saturation is 100%, which is normal. Head is normocephalic and atraumatic. PERRLA, EOMI. Oropharynx is clear. Neck is nontender and supple without adenopathy or JVD. Back is nontender and there is no CVA tenderness. Lungs are clear without rales, wheezes, or rhonchi. Chest is nontender. Heart has regular rate and rhythm without murmur. Abdomen is soft, flat, nontender without masses or hepatosplenomegaly and peristalsis is hypoactive. Extremities have no cyanosis or edema, full range of motion is present. Skin is warm and dry without rash. Neurologic: Mental status is normal, cranial nerves are intact, there are no motor or sensory deficits.  ED Course  Procedures (including critical care time) Results for orders placed during the hospital encounter of 09/22/12  RAPID STREP SCREEN      Result Value Range   Streptococcus, Group A Screen (Direct) NEGATIVE  NEGATIVE  CBC WITH DIFFERENTIAL      Result Value Range   WBC 8.8  4.0 - 10.5 K/uL   RBC 4.42  3.87 - 5.11 MIL/uL   Hemoglobin 13.5  12.0 - 15.0 g/dL   HCT 91.4  78.2 - 95.6 %   MCV 86.7  78.0 - 100.0 fL   MCH 30.5  26.0 - 34.0 pg   MCHC 35.2  30.0 - 36.0 g/dL   RDW 21.3  08.6 - 57.8 %   Platelets 176  150 - 400 K/uL   Neutrophils Relative % 79 (*) 43 -  77 %   Neutro Abs 7.0  1.7 - 7.7 K/uL   Lymphocytes Relative 14  12 - 46 %   Lymphs Abs 1.2  0.7 - 4.0 K/uL   Monocytes Relative 6  3 - 12 %   Monocytes Absolute 0.6  0.1 - 1.0 K/uL   Eosinophils Relative 1  0 - 5 %   Eosinophils Absolute 0.1  0.0 - 0.7 K/uL   Basophils Relative 0  0 - 1 %   Basophils Absolute 0.0  0.0 - 0.1 K/uL  BASIC METABOLIC PANEL      Result Value Range   Sodium 134 (*) 135 - 145 mEq/L   Potassium 3.7  3.5 - 5.1 mEq/L   Chloride 99  96 - 112 mEq/L   CO2 26  19 - 32 mEq/L   Glucose, Bld 93  70 - 99 mg/dL   BUN 14  6 - 23 mg/dL   Creatinine, Ser 4.09  0.50 - 1.10 mg/dL   Calcium 9.7  8.4 - 81.1 mg/dL   GFR calc non Af Amer >90  >90 mL/min   GFR  calc Af Amer >90  >90 mL/min   1. Gastroenteritis   2. Headache   3. Sore throat     MDM  Apparent viral syndrome with headache, sore throat, vomiting, diarrhea. She'll be given IV hydration, IV ondansetron, IV dexamethasone, IV ketorolac as well as oral loperamide. Strep screen will be obtained to rule out streptococcal pharyngitis.  Strep screen is negative. Laboratory workup is significant only for borderline hyponatremia. She feels much better after above noted treatment. She is discharged with prescription for ondansetron and she is advised to use over-the-counter loperamide as needed and over-the-counter ibuprofen and acetaminophen as needed.  Dione Booze, MD 09/23/12 [phone]

## 2012-09-24 LAB — CULTURE, GROUP A STREP

## 2012-10-25 ENCOUNTER — Encounter (HOSPITAL_COMMUNITY): Payer: Self-pay | Admitting: Emergency Medicine

## 2012-10-25 ENCOUNTER — Emergency Department (INDEPENDENT_AMBULATORY_CARE_PROVIDER_SITE_OTHER): Payer: 59

## 2012-10-25 ENCOUNTER — Emergency Department (HOSPITAL_COMMUNITY)
Admission: EM | Admit: 2012-10-25 | Discharge: 2012-10-25 | Disposition: A | Discharge status: Home / Self Care | Payer: 59 | Source: Home / Self Care | Attending: Emergency Medicine | Admitting: Emergency Medicine

## 2012-10-25 DIAGNOSIS — J40 Bronchitis, not specified as acute or chronic: Secondary | ICD-10-CM

## 2012-10-25 LAB — POCT RAPID STREP A: Streptococcus, Group A Screen (Direct): NEGATIVE

## 2012-10-25 MED ORDER — AZITHROMYCIN 250 MG PO TABS: ORAL_TABLET | ORAL | Status: DC

## 2012-10-25 NOTE — ED Provider Notes (Signed)
Medical screening examination/treatment/procedure(s) were performed by non-physician practitioner and as supervising physician I was immediately available for consultation/collaboration.  Leslee Home, M.D.  Reuben Likes, MD 10/25/12 [phone]

## 2012-10-25 NOTE — ED Notes (Signed)
Fever, sore throat x one week.  Reports frequent reoccurance of sore throats.  Reports drainage in back of throat.  Reports being seen in ed recently for the same.

## 2012-10-25 NOTE — ED Provider Notes (Signed)
  CSN: [id number]     Arrival date & time 10/25/12  1041 History     First MD Initiated Contact with Patient 10/25/12 1225     Chief Complaint  Patient presents with  . Sore Throat  . Fever   (Consider location/radiation/quality/duration/timing/severity/associated sxs/prior Treatment) Patient is a 27 y.o. female presenting with cough. The history is provided by the patient. No language interpreter was used.  Cough Cough characteristics:  Productive Sputum characteristics:  Clear Severity:  Moderate Onset quality:  Gradual Duration:  1 week Timing:  Constant Progression:  Worsening Chronicity:  New Smoker: no   Relieved by:  Nothing Worsened by:  Nothing tried Associated symptoms: sore throat   Associated symptoms: no fever    Pt complains of a sorethroat and cough Past Medical History  Diagnosis Date  . Migraine    Past Surgical History  Procedure Laterality Date  . Tonsillectomy     No family history on file. History  Substance Use Topics  . Smoking status: Never Smoker   . Smokeless tobacco: Not on file  . Alcohol Use: No   OB History   Grav Para Term Preterm Abortions TAB SAB Ect Mult Living   1    1  1         Review of Systems  Constitutional: Negative for fever.  HENT: Positive for sore throat.   Respiratory: Positive for cough.   All other systems reviewed and are negative.    Allergies  Peanut-containing drug products  Home Medications   Current Outpatient Rx  Name  Route  Sig  Dispense  Refill  . ibuprofen (ADVIL,MOTRIN) 200 MG tablet   Oral   Take 200 mg by mouth every 6 (six) hours as needed for pain.         Marland Kitchen ondansetron (ZOFRAN) 4 MG tablet   Oral   Take 1 tablet (4 mg total) by mouth every 6 (six) hours as needed for nausea.   20 tablet   0   . PRESCRIPTION MEDICATION   Oral   Take 1 tablet by mouth every evening. Birth Control          LMP 10/04/2012 Physical Exam  Nursing note and vitals reviewed. Constitutional: She  is oriented to person, place, and time. She appears well-developed and well-nourished.  HENT:  Head: Normocephalic.  Right Ear: External ear normal.  Left Ear: External ear normal.  Erythema throat,   Eyes: Conjunctivae are normal. Pupils are equal, round, and reactive to light.  Neck: Normal range of motion. Neck supple.  Cardiovascular: Normal rate.   Pulmonary/Chest: Effort normal.  Musculoskeletal: Normal range of motion.  Neurological: She is alert and oriented to person, place, and time.  Skin: Skin is warm.  Psychiatric: She has a normal mood and affect.    ED Course   Procedures (including critical care time)  Labs Reviewed  POCT RAPID STREP A ([facility] ONLY)   No results found. 1. Bronchitis     MDM  Zithromax,  Continue zofran  Elson Areas, PA-C 10/25/12 1348

## 2012-10-26 ENCOUNTER — Emergency Department (HOSPITAL_COMMUNITY)
Admission: EM | Admit: 2012-10-26 | Discharge: 2012-10-26 | Disposition: A | Discharge status: Home / Self Care | Payer: 59 | Attending: Emergency Medicine | Admitting: Emergency Medicine

## 2012-10-26 ENCOUNTER — Encounter (HOSPITAL_COMMUNITY): Payer: Self-pay | Admitting: Emergency Medicine

## 2012-10-26 DIAGNOSIS — R5381 Other malaise: Secondary | ICD-10-CM | POA: Insufficient documentation

## 2012-10-26 DIAGNOSIS — R1110 Vomiting, unspecified: Secondary | ICD-10-CM | POA: Insufficient documentation

## 2012-10-26 DIAGNOSIS — R079 Chest pain, unspecified: Secondary | ICD-10-CM | POA: Insufficient documentation

## 2012-10-26 DIAGNOSIS — R630 Anorexia: Secondary | ICD-10-CM | POA: Insufficient documentation

## 2012-10-26 DIAGNOSIS — J40 Bronchitis, not specified as acute or chronic: Secondary | ICD-10-CM

## 2012-10-26 DIAGNOSIS — R509 Fever, unspecified: Secondary | ICD-10-CM | POA: Insufficient documentation

## 2012-10-26 DIAGNOSIS — IMO0001 Reserved for inherently not codable concepts without codable children: Secondary | ICD-10-CM | POA: Insufficient documentation

## 2012-10-26 DIAGNOSIS — Z3202 Encounter for pregnancy test, result negative: Secondary | ICD-10-CM | POA: Insufficient documentation

## 2012-10-26 DIAGNOSIS — Z8669 Personal history of other diseases of the nervous system and sense organs: Secondary | ICD-10-CM | POA: Insufficient documentation

## 2012-10-26 DIAGNOSIS — J209 Acute bronchitis, unspecified: Secondary | ICD-10-CM | POA: Insufficient documentation

## 2012-10-26 LAB — CBC WITH DIFFERENTIAL/PLATELET
Basophils Relative: 0 % (ref 0–1)
Eosinophils Absolute: 0.1 10*3/uL (ref 0.0–0.7)
HCT: 41.1 % (ref 36.0–46.0)
Hemoglobin: 14.6 g/dL (ref 12.0–15.0)
Lymphs Abs: 1.3 10*3/uL (ref 0.7–4.0)
MCH: 30.5 pg (ref 26.0–34.0)
MCHC: 35.5 g/dL (ref 30.0–36.0)
Monocytes Absolute: 0.6 10*3/uL (ref 0.1–1.0)
Monocytes Relative: 11 % (ref 3–12)
RBC: 4.79 MIL/uL (ref 3.87–5.11)

## 2012-10-26 LAB — URINALYSIS, ROUTINE W REFLEX MICROSCOPIC
Glucose, UA: NEGATIVE mg/dL
Ketones, ur: NEGATIVE mg/dL
Protein, ur: NEGATIVE mg/dL
pH: 5.5 (ref 5.0–8.0)

## 2012-10-26 LAB — URINE MICROSCOPIC-ADD ON

## 2012-10-26 LAB — BASIC METABOLIC PANEL
CO2: 24 mEq/L (ref 19–32)
Calcium: 10.3 mg/dL (ref 8.4–10.5)
Chloride: 102 mEq/L (ref 96–112)
Glucose, Bld: 93 mg/dL (ref 70–99)
Sodium: 138 mEq/L (ref 135–145)

## 2012-10-26 MED ORDER — SODIUM CHLORIDE 0.9 % IV BOLUS (SEPSIS)
1000.0000 mL | Freq: Once | INTRAVENOUS | Status: AC
  Administered 2012-10-26: 1000 mL via INTRAVENOUS

## 2012-10-26 NOTE — ED Provider Notes (Signed)
CSN: [id number]     Arrival date & time 10/26/12  1011 History     First MD Initiated Contact with Patient 10/26/12 1200     Chief Complaint  Patient presents with  . Abdominal Pain  . Chest Pain   (Consider location/radiation/quality/duration/timing/severity/associated sxs/prior Treatment) Patient is a 27 y.o. female presenting with abdominal pain and chest pain. The history is provided by the patient. No language interpreter was used.  Abdominal Pain Pain location:  LUQ and RUQ Pain quality: aching   Worsened by:  Coughing Associated symptoms: chest pain, cough, fatigue, fever and vomiting   Associated symptoms: no chills, no diarrhea, no dysuria, no vaginal bleeding and no vaginal discharge   Associated symptoms comment:  She presents with productive, blood-tinged cough for several days, now with soreness in upper quadrant abdomen associated with cough. She had one episode of emesis 3 days ago secondary to cough. Three weeks ago she had a GI illness consisting of nausea and vomiting, generalized body aches, fatigue and subjective fever, but this illness resolved since that time. She also complains of persistent fatigue and intermittent generalized body aches for many months. This has been evaluated multiple times be her physician, she states, without answers or diagnoses.  Chest Pain Associated symptoms: abdominal pain, cough, fatigue, fever and vomiting     Past Medical History  Diagnosis Date  . Migraine    Past Surgical History  Procedure Laterality Date  . Tonsillectomy     No family history on file. History  Substance Use Topics  . Smoking status: Never Smoker   . Smokeless tobacco: Not on file  . Alcohol Use: No   OB History   Grav Para Term Preterm Abortions TAB SAB Ect Mult Living   1    1  1         Review of Systems  Constitutional: Positive for fever, appetite change and fatigue. Negative for chills and unexpected weight change.  HENT: Negative.    Respiratory: Positive for cough.   Cardiovascular: Positive for chest pain.  Gastrointestinal: Positive for vomiting and abdominal pain. Negative for diarrhea.  Genitourinary: Negative.  Negative for dysuria, vaginal bleeding and vaginal discharge.  Musculoskeletal: Positive for myalgias.  Skin: Negative.  Negative for rash.  Neurological: Negative.   Psychiatric/Behavioral: Negative.     Allergies  Peanut-containing drug products  Home Medications   Current Outpatient Rx  Name  Route  Sig  Dispense  Refill  . azithromycin (ZITHROMAX Z-PAK) 250 MG tablet      Two tablets first day then one a day   6 tablet   0   . ibuprofen (ADVIL,MOTRIN) 200 MG tablet   Oral   Take 200 mg by mouth every 6 (six) hours as needed for pain.         Marland Kitchen PRESCRIPTION MEDICATION   Oral   Take 1 tablet by mouth every evening. Birth Control          BP 96/68  Pulse 92  Temp(Src) 98.6 F (37 C)  Resp 16  SpO2 99%  LMP 10/04/2012 Physical Exam  Constitutional: She is oriented to person, place, and time. She appears well-developed and well-nourished.  HENT:  Head: Normocephalic.  Mouth/Throat: Oropharynx is clear and moist.  Neck: Normal range of motion. Neck supple.  Cardiovascular: Normal rate and regular rhythm.   Pulmonary/Chest: Effort normal and breath sounds normal. She has no wheezes. She has no rales.  Abdominal: Soft. Bowel sounds are normal. There is tenderness. There  is no rebound and no guarding.  Mild right and left upper quadrant tenderness.   Musculoskeletal: Normal range of motion. She exhibits no edema.  Neurological: She is alert and oriented to person, place, and time.  Skin: Skin is warm and dry. No rash noted.  Psychiatric: She has a normal mood and affect.    ED Course   Procedures (including critical care time)  Labs Reviewed  CBC WITH DIFFERENTIAL  BASIC METABOLIC PANEL  PREGNANCY, URINE  URINALYSIS, ROUTINE W REFLEX MICROSCOPIC   Results for orders  placed during the hospital encounter of 10/26/12  CBC WITH DIFFERENTIAL      Result Value Range   WBC 5.5  4.0 - 10.5 K/uL   RBC 4.79  3.87 - 5.11 MIL/uL   Hemoglobin 14.6  12.0 - 15.0 g/dL   HCT 16.1  09.6 - 04.5 %   MCV 85.8  78.0 - 100.0 fL   MCH 30.5  26.0 - 34.0 pg   MCHC 35.5  30.0 - 36.0 g/dL   RDW 40.9  81.1 - 91.4 %   Platelets 179  150 - 400 K/uL   Neutrophils Relative % 65  43 - 77 %   Neutro Abs 3.6  1.7 - 7.7 K/uL   Lymphocytes Relative 23  12 - 46 %   Lymphs Abs 1.3  0.7 - 4.0 K/uL   Monocytes Relative 11  3 - 12 %   Monocytes Absolute 0.6  0.1 - 1.0 K/uL   Eosinophils Relative 1  0 - 5 %   Eosinophils Absolute 0.1  0.0 - 0.7 K/uL   Basophils Relative 0  0 - 1 %   Basophils Absolute 0.0  0.0 - 0.1 K/uL  PREGNANCY, URINE      Result Value Range   Preg Test, Ur NEGATIVE  NEGATIVE  URINALYSIS, ROUTINE W REFLEX MICROSCOPIC      Result Value Range   Color, Urine YELLOW  YELLOW   APPearance CLOUDY (*) CLEAR   Specific Gravity, Urine 1.014  1.005 - 1.030   pH 5.5  5.0 - 8.0   Glucose, UA NEGATIVE  NEGATIVE mg/dL   Hgb urine dipstick TRACE (*) NEGATIVE   Bilirubin Urine NEGATIVE  NEGATIVE   Ketones, ur NEGATIVE  NEGATIVE mg/dL   Protein, ur NEGATIVE  NEGATIVE mg/dL   Urobilinogen, UA 0.2  0.0 - 1.0 mg/dL   Nitrite NEGATIVE  NEGATIVE   Leukocytes, UA MODERATE (*) NEGATIVE  BASIC METABOLIC PANEL      Result Value Range   Sodium 138  135 - 145 mEq/L   Potassium 4.1  3.5 - 5.1 mEq/L   Chloride 102  96 - 112 mEq/L   CO2 24  19 - 32 mEq/L   Glucose, Bld 93  70 - 99 mg/dL   BUN 9  6 - 23 mg/dL   Creatinine, Ser 7.82  0.50 - 1.10 mg/dL   Calcium 95.6  8.4 - 21.3 mg/dL   GFR calc non Af Amer >90  >90 mL/min   GFR calc Af Amer >90  >90 mL/min  URINE MICROSCOPIC-ADD ON      Result Value Range   Squamous Epithelial / LPF MANY (*) RARE   WBC, UA 7-10  <3 WBC/hpf   RBC / HPF 0-2  <3 RBC/hpf   Bacteria, UA MANY (*) RARE   Urine-Other MUCOUS PRESENT      Date:  10/26/2012  Rate: 86  Rhythm: normal sinus rhythm  QRS Axis: normal  Intervals:  PR shortened  ST/T Wave abnormalities: normal  Conduction Disutrbances:none  Narrative Interpretation:   Old EKG Reviewed: none available   Dg Chest 2 View  10/25/2012   *RADIOLOGY REPORT*  Clinical Data:  Cough for 2 days, chronic fever  CHEST - 2 VIEW  Comparison: None  Findings: Normal heart size and mediastinal contours. Peribronchial thickening and chronic accentuation perihilar markings again seen. No definite acute infiltrate, pleural effusion or pneumothorax. Bones unremarkable.  IMPRESSION: Chronic peribronchial thickening which can be seen with bronchitis or asthma. No acute infiltrate.   Original Report Authenticated By: Ulyses Southward, M.D.   No diagnosis found. 1. Bronchitis  MDM  She feels some better with IV fluids. She has no concerning lab studies and is on appropriate abx for diagnosis of bronchitis. Drinking PO fluids and appears NAD. Stable for discharge.   Arnoldo Hooker, PA-C 10/26/12 1401  Arnoldo Hooker, PA-C 10/26/12 1402

## 2012-10-26 NOTE — ED Notes (Signed)
staes that she was seen at [facility] yesterday for  N/v fever and cp dx w/ bronchititis states is not feeling better has h/a and cp now

## 2012-10-26 NOTE — ED Provider Notes (Signed)
Medical screening examination/treatment/procedure(s) were performed by non-physician practitioner and as supervising physician I was immediately available for consultation/collaboration.  Derwood Kaplan, MD 10/26/12 2053

## 2012-10-27 LAB — URINE CULTURE

## 2014-01-03 ENCOUNTER — Encounter (HOSPITAL_COMMUNITY): Payer: Self-pay | Admitting: Emergency Medicine

## 2015-03-05 HISTORY — PX: TOOTH EXTRACTION: SHX859

## 2015-03-14 ENCOUNTER — Other Ambulatory Visit (HOSPITAL_COMMUNITY): Payer: Self-pay | Admitting: Obstetrics & Gynecology

## 2015-03-14 DIAGNOSIS — N979 Female infertility, unspecified: Secondary | ICD-10-CM

## 2015-03-16 ENCOUNTER — Ambulatory Visit (HOSPITAL_COMMUNITY): Payer: 59

## 2015-04-26 ENCOUNTER — Encounter (INDEPENDENT_AMBULATORY_CARE_PROVIDER_SITE_OTHER): Payer: Self-pay

## 2015-04-26 ENCOUNTER — Ambulatory Visit (HOSPITAL_COMMUNITY)
Admission: RE | Admit: 2015-04-26 | Discharge: 2015-04-26 | Disposition: A | Discharge status: Home / Self Care | Payer: 59 | Source: Ambulatory Visit | Attending: Obstetrics & Gynecology | Admitting: Obstetrics & Gynecology

## 2015-04-26 DIAGNOSIS — N979 Female infertility, unspecified: Secondary | ICD-10-CM | POA: Diagnosis not present

## 2015-04-26 MED ORDER — IOHEXOL 300 MG/ML  SOLN
30.0000 mL | Freq: Once | INTRAMUSCULAR | Status: AC | PRN
  Administered 2015-04-26: 30 mL

## 2015-05-04 MED FILL — HYDROCODON-APAP 7.5-325: 7.5-325 | 4 days supply | Qty: 18 | Fill #0

## 2015-07-25 DIAGNOSIS — J069 Acute upper respiratory infection, unspecified: Secondary | ICD-10-CM | POA: Diagnosis not present

## 2015-07-25 MED FILL — CMPD-Prednisolone:Diphen: 2 days supply | Qty: 120 | Fill #0

## 2015-09-06 ENCOUNTER — Other Ambulatory Visit (HOSPITAL_COMMUNITY)
Admission: RE | Admit: 2015-09-06 | Discharge: 2015-09-06 | Disposition: A | Discharge status: Home / Self Care | Payer: 59 | Source: Ambulatory Visit | Attending: Obstetrics and Gynecology | Admitting: Obstetrics and Gynecology

## 2015-09-06 ENCOUNTER — Other Ambulatory Visit: Payer: Self-pay | Admitting: Obstetrics & Gynecology

## 2015-09-06 DIAGNOSIS — Z01419 Encounter for gynecological examination (general) (routine) without abnormal findings: Secondary | ICD-10-CM | POA: Diagnosis not present

## 2015-09-06 DIAGNOSIS — Z3169 Encounter for other general counseling and advice on procreation: Secondary | ICD-10-CM | POA: Diagnosis not present

## 2015-09-07 LAB — CYTOLOGY - PAP

## 2015-09-22 DIAGNOSIS — Z3201 Encounter for pregnancy test, result positive: Secondary | ICD-10-CM | POA: Diagnosis not present

## 2015-10-09 DIAGNOSIS — Z3400 Encounter for supervision of normal first pregnancy, unspecified trimester: Secondary | ICD-10-CM | POA: Diagnosis not present

## 2015-10-18 DIAGNOSIS — O26899 Other specified pregnancy related conditions, unspecified trimester: Secondary | ICD-10-CM | POA: Diagnosis not present

## 2015-10-18 DIAGNOSIS — Z3A01 Less than 8 weeks gestation of pregnancy: Secondary | ICD-10-CM | POA: Diagnosis not present

## 2015-10-22 ENCOUNTER — Encounter (HOSPITAL_BASED_OUTPATIENT_CLINIC_OR_DEPARTMENT_OTHER): Payer: Self-pay | Admitting: Emergency Medicine

## 2015-10-22 DIAGNOSIS — R51 Headache: Secondary | ICD-10-CM | POA: Insufficient documentation

## 2015-10-22 DIAGNOSIS — Z79899 Other long term (current) drug therapy: Secondary | ICD-10-CM | POA: Diagnosis not present

## 2015-10-22 DIAGNOSIS — R112 Nausea with vomiting, unspecified: Secondary | ICD-10-CM | POA: Diagnosis not present

## 2015-10-22 DIAGNOSIS — G4489 Other headache syndrome: Secondary | ICD-10-CM | POA: Diagnosis not present

## 2015-10-22 NOTE — ED Triage Notes (Signed)
Pt states she started vomiting and laid down and woke up with a HA.

## 2015-10-23 ENCOUNTER — Emergency Department (HOSPITAL_BASED_OUTPATIENT_CLINIC_OR_DEPARTMENT_OTHER)
Admission: EM | Admit: 2015-10-23 | Discharge: 2015-10-23 | Disposition: A | Discharge status: Home / Self Care | Payer: 59 | Attending: Emergency Medicine | Admitting: Emergency Medicine

## 2015-10-23 DIAGNOSIS — R51 Headache: Secondary | ICD-10-CM | POA: Diagnosis not present

## 2015-10-23 DIAGNOSIS — R112 Nausea with vomiting, unspecified: Secondary | ICD-10-CM

## 2015-10-23 DIAGNOSIS — G4489 Other headache syndrome: Secondary | ICD-10-CM

## 2015-10-23 DIAGNOSIS — Z79899 Other long term (current) drug therapy: Secondary | ICD-10-CM | POA: Diagnosis not present

## 2015-10-23 LAB — CBC WITH DIFFERENTIAL/PLATELET
BASOS PCT: 0 %
Basophils Absolute: 0 10*3/uL (ref 0.0–0.1)
EOS ABS: 0.1 10*3/uL (ref 0.0–0.7)
Eosinophils Relative: 1 %
HCT: 39.6 % (ref 36.0–46.0)
HEMOGLOBIN: 13.8 g/dL (ref 12.0–15.0)
LYMPHS ABS: 2.5 10*3/uL (ref 0.7–4.0)
Lymphocytes Relative: 37 %
MCH: 30.3 pg (ref 26.0–34.0)
MCHC: 34.8 g/dL (ref 30.0–36.0)
MCV: 87 fL (ref 78.0–100.0)
Monocytes Absolute: 0.7 10*3/uL (ref 0.1–1.0)
Monocytes Relative: 11 %
NEUTROS PCT: 51 %
Neutro Abs: 3.3 10*3/uL (ref 1.7–7.7)
Platelets: 222 10*3/uL (ref 150–400)
RBC: 4.55 MIL/uL (ref 3.87–5.11)
RDW: 12.4 % (ref 11.5–15.5)
WBC: 6.6 10*3/uL (ref 4.0–10.5)

## 2015-10-23 LAB — URINALYSIS, ROUTINE W REFLEX MICROSCOPIC
Bilirubin Urine: NEGATIVE
Glucose, UA: NEGATIVE mg/dL
Hgb urine dipstick: NEGATIVE
Ketones, ur: NEGATIVE mg/dL
LEUKOCYTES UA: NEGATIVE
Nitrite: NEGATIVE
PROTEIN: NEGATIVE mg/dL
Specific Gravity, Urine: 1.006 (ref 1.005–1.030)
pH: 7.5 (ref 5.0–8.0)

## 2015-10-23 LAB — COMPREHENSIVE METABOLIC PANEL
ALK PHOS: 46 U/L (ref 38–126)
ALT: 21 U/L (ref 14–54)
ANION GAP: 5 (ref 5–15)
AST: 21 U/L (ref 15–41)
Albumin: 3.9 g/dL (ref 3.5–5.0)
BILIRUBIN TOTAL: 0.3 mg/dL (ref 0.3–1.2)
BUN: 6 mg/dL (ref 6–20)
CO2: 25 mmol/L (ref 22–32)
CREATININE: 0.36 mg/dL — AB (ref 0.44–1.00)
Calcium: 9.1 mg/dL (ref 8.9–10.3)
Chloride: 105 mmol/L (ref 101–111)
Glucose, Bld: 94 mg/dL (ref 65–99)
Potassium: 3.4 mmol/L — ABNORMAL LOW (ref 3.5–5.1)
SODIUM: 135 mmol/L (ref 135–145)
Total Protein: 7.4 g/dL (ref 6.5–8.1)

## 2015-10-23 LAB — LIPASE, BLOOD: LIPASE: 44 U/L (ref 11–51)

## 2015-10-23 MED ORDER — SODIUM CHLORIDE 0.9 % IV BOLUS (SEPSIS)
1000.0000 mL | Freq: Once | INTRAVENOUS | Status: AC
Start: 2015-10-23 — End: 2015-10-23
  Administered 2015-10-23: 1000 mL via INTRAVENOUS

## 2015-10-23 MED ORDER — ONDANSETRON 8 MG PO TBDP: ORAL_TABLET | ORAL | 0 refills | Status: DC

## 2015-10-23 MED ORDER — ONDANSETRON HCL 4 MG/2ML IJ SOLN
4.0000 mg | Freq: Once | INTRAMUSCULAR | Status: AC
  Administered 2015-10-23: 4 mg via INTRAVENOUS
  Filled 2015-10-23: qty 2

## 2015-10-23 NOTE — ED Notes (Signed)
C/o HA, onset 1600, also vomited 12x since 1600, denies nausea at this time (denies: nd, fever, dizziness, injury, syncope, dizziness or other sx), reports being [redacted] wks pregnant, G1P0, [facility] on [address] with last visit on 8/16.

## 2015-10-23 NOTE — ED Notes (Signed)
Up to b/r, steady gait, no changes. 

## 2015-10-23 NOTE — ED Notes (Signed)
Dr. Wickline at [facility].  

## 2015-10-23 NOTE — ED Provider Notes (Signed)
[facility] Provider Note   CSN: [id number] Arrival date & time: 10/22/15  2228     History   Chief Complaint Chief Complaint  Patient presents with  . Headache    HPI Chelsea Ballard is a 30 y.o. female.  The history is provided by the patient.  Emesis   This is a new problem. Episode onset: several hours ago. The problem occurs 5 to 10 times per day. The problem has been gradually worsening. The emesis has an appearance of stomach contents (no blood). There has been no fever. Associated symptoms include headaches. Pertinent negatives include no abdominal pain, no cough, no diarrhea and no fever.  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 Migraine     Patient Active Problem List   Diagnosis Date Noted  . HEMORRHOIDS 05/15/2010  . CONSTIPATION 05/15/2010  . 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 ACID REFLUX DISEASE 03/23/2010  . FOLLICULITIS 03/23/2010  . ACUTE PHARYNGITIS 04/06/2007  . CONTACT DERMATITIS 04/06/2007    Past Surgical History:  Procedure Laterality Date  . TONSILLECTOMY      OB History    Gravida Para Term Preterm AB Living   2       1     SAB TAB Ectopic Multiple Live Births   1               Home Medications    Prior to Admission medications   Medication Sig Start Date End Date Taking? Authorizing Provider  Prenatal Multivit-Min-Fe-FA (PRE-NATAL PO) Take by mouth.   Yes Historical Provider, MD    Family History History reviewed. No pertinent family history.  Social History Social History  Substance Use Topics  . Smoking status: Never Smoker  . Smokeless tobacco: Never Used  . Alcohol use No      Allergies   Peanut-containing drug products   Review of Systems Review of Systems  Constitutional: Negative for fever.  Respiratory: Negative for cough.   Gastrointestinal: Positive for vomiting. Negative for abdominal pain and diarrhea.  Genitourinary: Negative for vaginal bleeding.  Neurological: Positive for headaches.  All other systems reviewed and are negative.    Physical Exam Updated Vital Signs BP 108/87   Pulse 82   Temp 98 F (36.7 C) (Oral)   Resp 20   Ht 5\' 1"  (1.549 m)   Wt 52.6 kg   LMP 04/17/2015   SpO2 96%   BMI 21.92 kg/m   Physical Exam CONSTITUTIONAL: Well developed/well nourished HEAD: Normocephalic/atraumatic EYES: EOMI/PERRL ENMT: Mucous membranes dry NECK: supple no meningeal signs SPINE/BACK:entire spine nontender CV: S1/S2 noted, no murmurs/rubs/gallops noted LUNGS: Lungs are clear to auscultation bilaterally, no apparent distress ABDOMEN: soft, nontender, no rebound or guarding, bowel sounds noted throughout abdomen GU:no cva tenderness NEURO: Pt is awake/alert/appropriate, moves all extremitiesx4.  No facial droop.  No arm/leg drift EXTREMITIES: pulses normal/equal, full ROM SKIN: warm, color normal PSYCH: no abnormalities of mood noted, alert and oriented to situation   ED Treatments / Results  Labs (all labs ordered are listed, but only abnormal results are displayed) Labs Reviewed  COMPREHENSIVE METABOLIC PANEL - Abnormal;  Notable for the following:       Result Value   Potassium 3.4 (*)    Creatinine, Ser 0.36 (*)    All other components within normal limits  CBC WITH DIFFERENTIAL/PLATELET  LIPASE, BLOOD  URINALYSIS, ROUTINE W REFLEX MICROSCOPIC (NOT AT [facility])    EKG  EKG Interpretation None       Radiology No results found.  Procedures Procedures (including critical care time)  Medications Ordered in ED Medications  sodium chloride 0.9 % bolus 1,000 mL (0 mLs Intravenous Stopped 10/23/15 0357)   ondansetron (ZOFRAN) injection 4 mg (4 mg Intravenous Given 10/23/15 0305)     Initial Impression / Assessment and Plan / ED Course  I have reviewed the triage vital signs and the nursing notes.  Pertinent labs results that were available during my care of the patient were reviewed by me and considered in my medical decision making (see chart for details).  Clinical Course    Pt improved Well appearing No vomiting She is ambulatory Denies abd pain Reports HA improved Will d/c home Will give zofran for nausea at home She plans to f/u with OB this week  Final Clinical Impressions(s) / ED Diagnoses   Final diagnoses:  None    New Prescriptions New Prescriptions   No medications on file     Zadie Rhineonald Carrin Vannostrand, MD 10/23/15 [phone]

## 2015-10-23 NOTE — ED Notes (Signed)
"  feel better", denies nausea, no vomiting in ED, HA improved, 2/10, up to b/r, steady gait, given PO fluids.

## 2015-11-09 DIAGNOSIS — Z3401 Encounter for supervision of normal first pregnancy, first trimester: Secondary | ICD-10-CM | POA: Diagnosis not present

## 2015-11-16 MED FILL — DICLEGIS DR 10-10 MG TABLET: 10-10 | 30 days supply | Qty: 120 | Fill #0

## 2015-11-16 MED FILL — ONDANSETRON ODT 8 MG TABLET: 8 | 7 days supply | Qty: 21 | Fill #0

## 2015-12-07 DIAGNOSIS — Z23 Encounter for immunization: Secondary | ICD-10-CM | POA: Diagnosis not present

## 2015-12-07 DIAGNOSIS — Z3402 Encounter for supervision of normal first pregnancy, second trimester: Secondary | ICD-10-CM | POA: Diagnosis not present

## 2016-01-05 DIAGNOSIS — Z3402 Encounter for supervision of normal first pregnancy, second trimester: Secondary | ICD-10-CM | POA: Diagnosis not present

## 2016-02-06 ENCOUNTER — Encounter (HOSPITAL_COMMUNITY): Payer: Self-pay

## 2016-02-06 ENCOUNTER — Inpatient Hospital Stay (HOSPITAL_COMMUNITY)
Admission: AD | Admit: 2016-02-06 | Discharge: 2016-02-07 | Disposition: A | Discharge status: Home / Self Care | Payer: Medicaid Other | Source: Ambulatory Visit | Attending: Obstetrics and Gynecology | Admitting: Obstetrics and Gynecology

## 2016-02-06 DIAGNOSIS — O4702 False labor before 37 completed weeks of gestation, second trimester: Secondary | ICD-10-CM | POA: Diagnosis not present

## 2016-02-06 DIAGNOSIS — Z3A23 23 weeks gestation of pregnancy: Secondary | ICD-10-CM | POA: Insufficient documentation

## 2016-02-06 DIAGNOSIS — O26892 Other specified pregnancy related conditions, second trimester: Secondary | ICD-10-CM | POA: Insufficient documentation

## 2016-02-06 DIAGNOSIS — R0781 Pleurodynia: Secondary | ICD-10-CM

## 2016-02-06 DIAGNOSIS — R109 Unspecified abdominal pain: Secondary | ICD-10-CM | POA: Diagnosis present

## 2016-02-06 LAB — CBC
HEMATOCRIT: 34.4 % — AB (ref 36.0–46.0)
Hemoglobin: 11.8 g/dL — ABNORMAL LOW (ref 12.0–15.0)
MCH: 30.7 pg (ref 26.0–34.0)
MCHC: 34.3 g/dL (ref 30.0–36.0)
MCV: 89.6 fL (ref 78.0–100.0)
Platelets: 224 10*3/uL (ref 150–400)
RBC: 3.84 MIL/uL — ABNORMAL LOW (ref 3.87–5.11)
RDW: 13.3 % (ref 11.5–15.5)
WBC: 8.2 10*3/uL (ref 4.0–10.5)

## 2016-02-06 LAB — URINALYSIS, ROUTINE W REFLEX MICROSCOPIC
BILIRUBIN URINE: NEGATIVE
GLUCOSE, UA: NEGATIVE mg/dL
HGB URINE DIPSTICK: NEGATIVE
Ketones, ur: NEGATIVE mg/dL
Leukocytes, UA: NEGATIVE
Nitrite: NEGATIVE
Protein, ur: NEGATIVE mg/dL
SPECIFIC GRAVITY, URINE: 1.011 (ref 1.005–1.030)
pH: 6 (ref 5.0–8.0)

## 2016-02-06 LAB — LIPASE, BLOOD: LIPASE: 28 U/L (ref 11–51)

## 2016-02-06 LAB — COMPREHENSIVE METABOLIC PANEL
ALT: 18 U/L (ref 14–54)
ANION GAP: 6 (ref 5–15)
AST: 22 U/L (ref 15–41)
Albumin: 3.3 g/dL — ABNORMAL LOW (ref 3.5–5.0)
Alkaline Phosphatase: 67 U/L (ref 38–126)
BILIRUBIN TOTAL: 0.4 mg/dL (ref 0.3–1.2)
BUN: 8 mg/dL (ref 6–20)
CO2: 24 mmol/L (ref 22–32)
Calcium: 9 mg/dL (ref 8.9–10.3)
Chloride: 106 mmol/L (ref 101–111)
Creatinine, Ser: 0.44 mg/dL (ref 0.44–1.00)
Glucose, Bld: 96 mg/dL (ref 65–99)
POTASSIUM: 3.5 mmol/L (ref 3.5–5.1)
Sodium: 136 mmol/L (ref 135–145)
TOTAL PROTEIN: 7 g/dL (ref 6.5–8.1)

## 2016-02-06 LAB — AMYLASE: AMYLASE: 83 U/L (ref 28–100)

## 2016-02-06 NOTE — MAU Note (Signed)
Pt presents complaining of sharp right sided upper abdominal pain that started 3 days ago. Denies HA or visual changed. Reports good fetal movement. Denies leaking or bleeding.

## 2016-02-07 DIAGNOSIS — O4702 False labor before 37 completed weeks of gestation, second trimester: Secondary | ICD-10-CM | POA: Diagnosis not present

## 2016-02-07 DIAGNOSIS — O26892 Other specified pregnancy related conditions, second trimester: Secondary | ICD-10-CM | POA: Diagnosis not present

## 2016-02-07 DIAGNOSIS — R0781 Pleurodynia: Secondary | ICD-10-CM | POA: Diagnosis not present

## 2016-02-07 DIAGNOSIS — Z3A23 23 weeks gestation of pregnancy: Secondary | ICD-10-CM | POA: Diagnosis not present

## 2016-02-07 LAB — FETAL FIBRONECTIN: FETAL FIBRONECTIN: NEGATIVE

## 2016-02-07 MED ORDER — IBUPROFEN 800 MG PO TABS
800.0000 mg | ORAL_TABLET | Freq: Once | ORAL | Status: AC
  Administered 2016-02-07: 800 mg via ORAL
  Filled 2016-02-07: qty 1

## 2016-02-07 MED ORDER — IBUPROFEN 600 MG PO TABS: 600.0000 mg | ORAL_TABLET | Freq: Four times a day (QID) | ORAL | 0 refills | Status: DC | PRN

## 2016-02-07 NOTE — MAU Provider Note (Signed)
Chief Complaint:  Abdominal Pain   First Provider Initiated Contact with Patient 02/07/16 0020     HPI: Chelsea Ballard is a 30 y.o. G2P0010 at [redacted]w[redacted]d who presents to maternity admissions reporting right upper quadrant/rib pain x 3 days. Initially thought it was from her bra being too tight, but has tried loosening her bra and wearing a less often and pain is not improved.  Location: Right rib, under right breast Quality: sore Severity: moderate Duration: 3 days Context: none Timing: Constant Modifying factors: No improvement with loosening her bra. Hasn't tried anything else for the pain Associated signs and symptoms: Negative for fever, chills, nausea, vomiting, diarrhea, constipation. Denies any injury to area.  Denies contractions, leakage of fluid or vaginal bleeding. Good fetal movement.   Past Medical History:  Diagnosis Date  . Migraine    OB History  Gravida Para Term Preterm AB Living  2       1    SAB TAB Ectopic Multiple Live Births  1            # Outcome Date GA Lbr Len/2nd Weight Sex Delivery Anes PTL Lv  2 Current           1 SAB              Past Surgical History:  Procedure Laterality Date  . TONSILLECTOMY     No family history on file. Social History  Substance Use Topics  . Smoking status: Never Smoker  . Smokeless tobacco: Never Used  . Alcohol use No   Allergies  Allergen Reactions  . Peanut-Containing Drug Products Rash   Prescriptions Prior to Admission  Medication Sig Dispense Refill Last Dose  . Prenatal Multivit-Min-Fe-FA (PRE-NATAL PO) Take by mouth.   02/06/2016 at Unknown time  . ondansetron (ZOFRAN ODT) 8 MG disintegrating tablet 8mg  ODT q4 hours prn nausea 4 tablet 0 More than a month at Unknown time    I have reviewed patient's Past Medical Hx, Surgical Hx, Family Hx, Social Hx, medications and allergies.   ROS:  Review of Systems  Physical Exam   Patient Vitals for the past 24 hrs:  BP Temp Temp src Pulse Resp  02/06/16 2256  117/74 98 F (36.7 C) Oral 80 18   Constitutional: Well-developed, well-nourished female in no acute distress.  Cardiovascular: normal rate Respiratory: normal effort GI: Abd soft, non-tender, gravid appropriate for gestational age. Pos BS x 4 MS: Right lower ribs mildly tender to palpation. Neurologic: Alert and oriented x 4.  GU: Neg CVAT.  Pelvic: NEFG, physiologic discharge, no blood.  Dilation: Closed Effacement (%): Thick Cervical Position: Posterior Exam by:: Dorathy Kinsman CNM  FHT:  Baseline 140 , moderate variability, accelerations present, no decelerations Contractions: q 4-8 mins   Labs: Results for orders placed or performed during the hospital encounter of 02/06/16 (from the past 24 hour(s))  Urinalysis, Routine w reflex microscopic     Status: None   Collection Time: 02/06/16 10:43 PM  Result Value Ref Range   Color, Urine YELLOW YELLOW   APPearance CLEAR CLEAR   Specific Gravity, Urine 1.011 1.005 - 1.030   pH 6.0 5.0 - 8.0   Glucose, UA NEGATIVE NEGATIVE mg/dL   Hgb urine dipstick NEGATIVE NEGATIVE   Bilirubin Urine NEGATIVE NEGATIVE   Ketones, ur NEGATIVE NEGATIVE mg/dL   Protein, ur NEGATIVE NEGATIVE mg/dL   Nitrite NEGATIVE NEGATIVE   Leukocytes, UA NEGATIVE NEGATIVE  CBC     Status: Abnormal  Collection Time: 02/06/16 11:31 PM  Result Value Ref Range   WBC 8.2 4.0 - 10.5 K/uL   RBC 3.84 (L) 3.87 - 5.11 MIL/uL   Hemoglobin 11.8 (L) 12.0 - 15.0 g/dL   HCT 16.134.4 (L) 09.636.0 - 04.546.0 %   MCV 89.6 78.0 - 100.0 fL   MCH 30.7 26.0 - 34.0 pg   MCHC 34.3 30.0 - 36.0 g/dL   RDW 40.913.3 81.111.5 - 91.415.5 %   Platelets 224 150 - 400 K/uL  Comprehensive metabolic panel     Status: Abnormal   Collection Time: 02/06/16 11:31 PM  Result Value Ref Range   Sodium 136 135 - 145 mmol/L   Potassium 3.5 3.5 - 5.1 mmol/L   Chloride 106 101 - 111 mmol/L   CO2 24 22 - 32 mmol/L   Glucose, Bld 96 65 - 99 mg/dL   BUN 8 6 - 20 mg/dL   Creatinine, Ser 7.820.44 0.44 - 1.00 mg/dL    Calcium 9.0 8.9 - 95.610.3 mg/dL   Total Protein 7.0 6.5 - 8.1 g/dL   Albumin 3.3 (L) 3.5 - 5.0 g/dL   AST 22 15 - 41 U/L   ALT 18 14 - 54 U/L   Alkaline Phosphatase 67 38 - 126 U/L   Total Bilirubin 0.4 0.3 - 1.2 mg/dL   GFR calc non Af Amer >60 >60 mL/min   GFR calc Af Amer >60 >60 mL/min   Anion gap 6 5 - 15  Amylase     Status: None   Collection Time: 02/06/16 11:31 PM  Result Value Ref Range   Amylase 83 28 - 100 U/L  Lipase, blood     Status: None   Collection Time: 02/06/16 11:31 PM  Result Value Ref Range   Lipase 28 11 - 51 U/L  Fetal fibronectin     Status: None   Collection Time: 02/07/16  1:15 AM  Result Value Ref Range   Fetal Fibronectin NEGATIVE NEGATIVE    Imaging:  No results found.  MAU Course: Orders Placed This Encounter  Procedures  . Urinalysis, Routine w reflex microscopic  . CBC  . Comprehensive metabolic panel  . Amylase  . Lipase, blood  . Fetal fibronectin   Meds ordered this encounter  Medications  . ibuprofen (ADVIL,MOTRIN) tablet 800 mg  . ibuprofen (ADVIL,MOTRIN) 600 MG tablet    Sig: Take 1 tablet (600 mg total) by mouth every 6 (six) hours as needed. Do not use after [redacted] weeks gestation    Dispense:  12 tablet    Refill:  0    Order Specific Question:   Supervising Provider    Answer:   Jaynie CollinsANYANWU, UGONNA A [[id number]]   Discussed history, exam, labs with Dr. Normand Sloopillard. Agrees with plan of care. No new orders.  MDM: - Right rib pain possibly due to costochondritis. No evidence of emergent condition. - Preterm contractions without evidence of active preterm labor.  Assessment: 1. Premature uterine contractions, antepartum, second trimester   2. Rib pain on right side     Plan: Discharge home in stable conditionPer consult with Dr. Normand Sloopillard.  Preterm Labor precautions and fetal kick counts Comfort measures. Use ibuprofen sparingly for up to 48 hours if pains worsens. Do not use third trimester. Follow-up Information    [facility] Follow up.   Specialty:  Obstetrics and Gynecology Why:  as scheduled for prenatal visit or sooner as needed if symptoms worsen Contact information: [address]  [place] [address] [phone]        [facility] AD[facility] Follow up.   Why:  as needed in emergencies Contact information: [address] [id number] [place] [id number] [phone]            Medication List    TAKE these medications   ibuprofen 600 MG tablet Commonly known as:  ADVIL,MOTRIN Take 1 tablet (600 mg total) by mouth every 6 (six) hours as needed. Do not use after [redacted] weeks gestation   ondansetron 8 MG disintegrating tablet Commonly known as:  ZOFRAN ODT 8mg  ODT q4 hours prn nausea   PRE-NATAL PO Take by mouth.       [place] Asjah Rauda, CNM 02/07/2016 2:43 AM

## 2016-02-07 NOTE — Discharge Instructions (Signed)
Chest Wall Pain Chest wall pain is pain in or around the bones and muscles of your chest. Sometimes, an injury causes this pain. Sometimes, the cause may not be known. This pain may take several weeks or longer to get better. Follow these instructions at home: Pay attention to any changes in your symptoms. Take these actions to help with your pain:  Rest as told by your health care provider.  Avoid activities that cause pain. These include any activities that use your chest muscles or your abdominal and side muscles to lift heavy items.  If directed, apply ice to the painful area:  Put ice in a plastic bag.  Place a towel between your skin and the bag.  Leave the ice on for 20 minutes, 2-3 times per day.  Take over-the-counter and prescription medicines only as told by your health care provider.  Do not use tobacco products, including cigarettes, chewing tobacco, and e-cigarettes. If you need help quitting, ask your health care provider.  Keep all follow-up visits as told by your health care provider. This is important. Contact a health care provider if:  You have a fever.  Your chest pain becomes worse.  You have new symptoms. Get help right away if:  You have nausea or vomiting.  You feel sweaty or light-headed.  You have a cough with phlegm (sputum) or you cough up blood.  You develop shortness of breath. This information is not intended to replace advice given to you by your health care provider. Make sure you discuss any questions you have with your health care provider. Document Released: 02/18/2005 Document Revised: 06/29/2015 Document Reviewed: 05/16/2014 Elsevier Interactive Patient Education  2017 [employer]   Preterm Labor and Birth Information The normal length of a pregnancy is 39-41 weeks. Preterm labor is when labor starts before 37 completed weeks of pregnancy. What are the risk factors for preterm labor? Preterm labor is more likely to occur in women  who:  Have certain infections during pregnancy such as a bladder infection, sexually transmitted infection, or infection inside the uterus (chorioamnionitis).  Have a shorter-than-normal cervix.  Have gone into preterm labor before.  Have had surgery on their cervix.  Are younger than age 29 or older than age 29.  Are African American.  Are pregnant with twins or multiple babies (multiple gestation).  Take street drugs or smoke while pregnant.  Do not gain enough weight while pregnant.  Became pregnant shortly after having been pregnant. What are the symptoms of preterm labor? Symptoms of preterm labor include:  Cramps similar to those that can happen during a menstrual period. The cramps may happen with diarrhea.  Pain in the abdomen or lower back.  Regular uterine contractions that may feel like tightening of the abdomen.  A feeling of increased pressure in the pelvis.  Increased watery or bloody mucus discharge from the vagina.  Water breaking (ruptured amniotic sac). Why is it important to recognize signs of preterm labor? It is important to recognize signs of preterm labor because babies who are born prematurely may not be fully developed. This can put them at an increased risk for:  Long-term (chronic) heart and lung problems.  Difficulty immediately after birth with regulating body systems, including blood sugar, body temperature, heart rate, and breathing rate.  Bleeding in the brain.  Cerebral palsy.  Learning difficulties.  Death. These risks are highest for babies who are born before 34 weeks of pregnancy. How is preterm labor treated? Treatment depends on  the length of your pregnancy, your condition, and the health of your baby. It may involve:  Having a stitch (suture) placed in your cervix to prevent your cervix from opening too early (cerclage).  Taking or being given medicines, such as:  Hormone medicines. These may be given early in pregnancy  to help support the pregnancy.  Medicine to stop contractions.  Medicines to help mature the babys lungs. These may be prescribed if the risk of delivery is high.  Medicines to prevent your baby from developing cerebral palsy. If the labor happens before 34 weeks of pregnancy, you may need to stay in the hospital. What should I do if I think I am in preterm labor? If you think that you are going into preterm labor, call your health care provider right away. How can I prevent preterm labor in future pregnancies? To increase your chance of having a full-term pregnancy:  Do not use any tobacco products, such as cigarettes, chewing tobacco, and e-cigarettes. If you need help quitting, ask your health care provider.  Do not use street drugs or medicines that have not been prescribed to you during your pregnancy.  Talk with your health care provider before taking any herbal supplements, even if you have been taking them regularly.  Make sure you gain a healthy amount of weight during your pregnancy.  Watch for infection. If you think that you might have an infection, get it checked right away.  Make sure to tell your health care provider if you have gone into preterm labor before. This information is not intended to replace advice given to you by your health care provider. Make sure you discuss any questions you have with your health care provider. Document Released: 05/11/2003 Document Revised: 08/01/2015 Document Reviewed: 07/12/2015 Elsevier Interactive Patient Education  2017 [employer]

## 2016-02-16 MED FILL — CITRANATAL 90 DHA COMBO PAC: 90-1 & 300 | 30 days supply | Qty: 60 | Fill #0

## 2016-02-29 DIAGNOSIS — Z3402 Encounter for supervision of normal first pregnancy, second trimester: Secondary | ICD-10-CM | POA: Diagnosis not present

## 2016-03-25 DIAGNOSIS — Z3403 Encounter for supervision of normal first pregnancy, third trimester: Secondary | ICD-10-CM | POA: Diagnosis not present

## 2016-03-25 DIAGNOSIS — Z23 Encounter for immunization: Secondary | ICD-10-CM | POA: Diagnosis not present

## 2016-04-08 MED FILL — CITRANATAL 90 DHA COMBO PAC: 90-1 & 300 | 30 days supply | Qty: 60 | Fill #1

## 2016-04-11 ENCOUNTER — Other Ambulatory Visit: Payer: Self-pay | Admitting: Obstetrics and Gynecology

## 2016-04-11 DIAGNOSIS — R1011 Right upper quadrant pain: Secondary | ICD-10-CM

## 2016-04-18 ENCOUNTER — Ambulatory Visit
Admission: RE | Admit: 2016-04-18 | Discharge: 2016-04-18 | Disposition: A | Discharge status: Home / Self Care | Payer: Medicaid Other | Source: Ambulatory Visit | Attending: Obstetrics and Gynecology | Admitting: Obstetrics and Gynecology

## 2016-04-18 DIAGNOSIS — R1011 Right upper quadrant pain: Secondary | ICD-10-CM

## 2016-05-08 MED FILL — CITRANATAL 90 DHA COMBO PAC: 90-1 & 300 | 30 days supply | Qty: 60 | Fill #2

## 2016-07-30 MED FILL — NORETHINDRONE 0.35 MG TAB: 0.35 | 28 days supply | Qty: 28 | Fill #0

## 2016-07-30 MED FILL — CITRANATAL 90 DHA COMBO PAC: 90-1 & 300 | 30 days supply | Qty: 60 | Fill #3

## 2016-12-08 IMAGING — RF DG HYSTEROGRAM
4 series · 4 of 4 positions shown · IV contrast (omnipaque)
Comparison: None.

CLINICAL DATA: Infertility

EXAM:
HYSTEROSALPINGOGRAM
TECHNIQUE: Following cleansing of the cervix and vagina with Betadine solution,
a hysterosalpingogram was performed using a 5-French
hysterosalpingogram catheter and Omnipaque 300 contrast. The patient
tolerated the examination without difficulty.

[Series 1: run · 1 of 1 slices shown (1 of 4)]
[im 1/1]
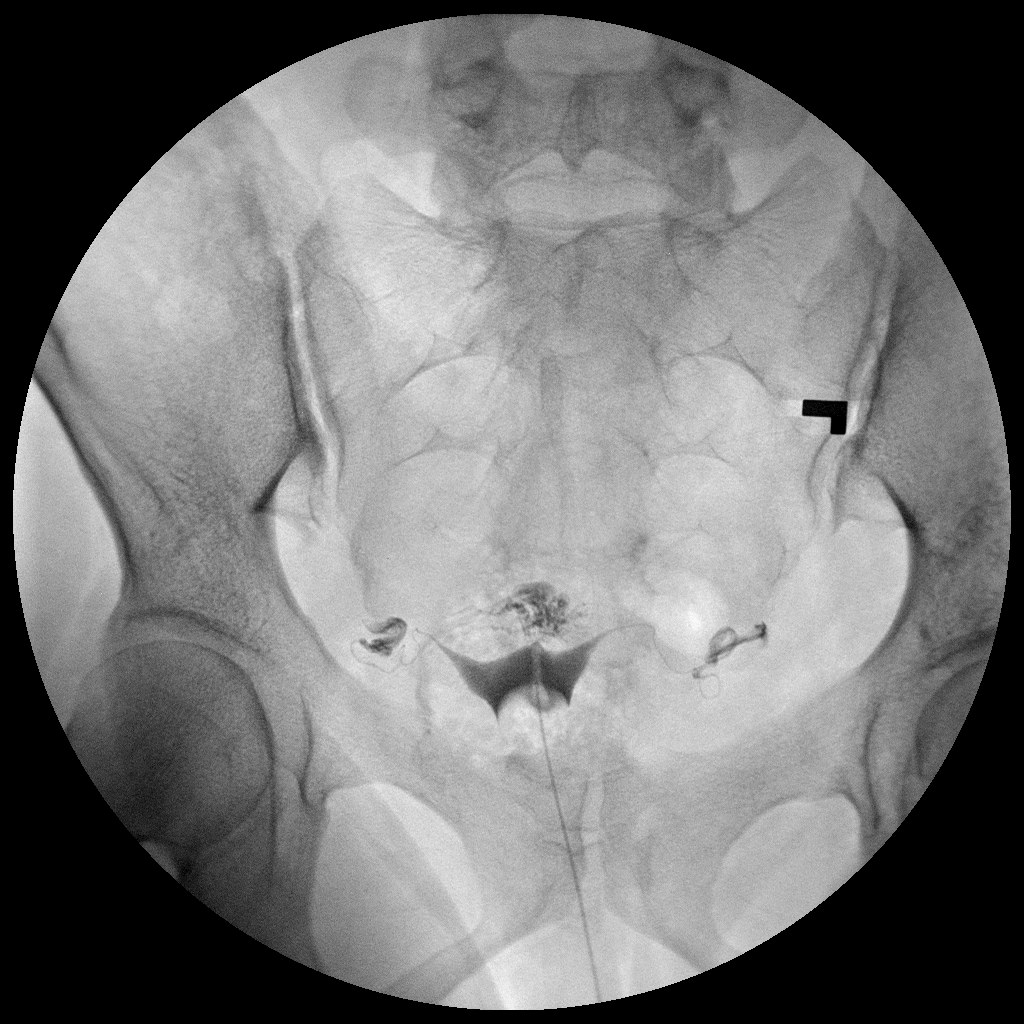

[Series 2: run · 1 of 1 slices shown (2 of 4)]
[im 1/1]
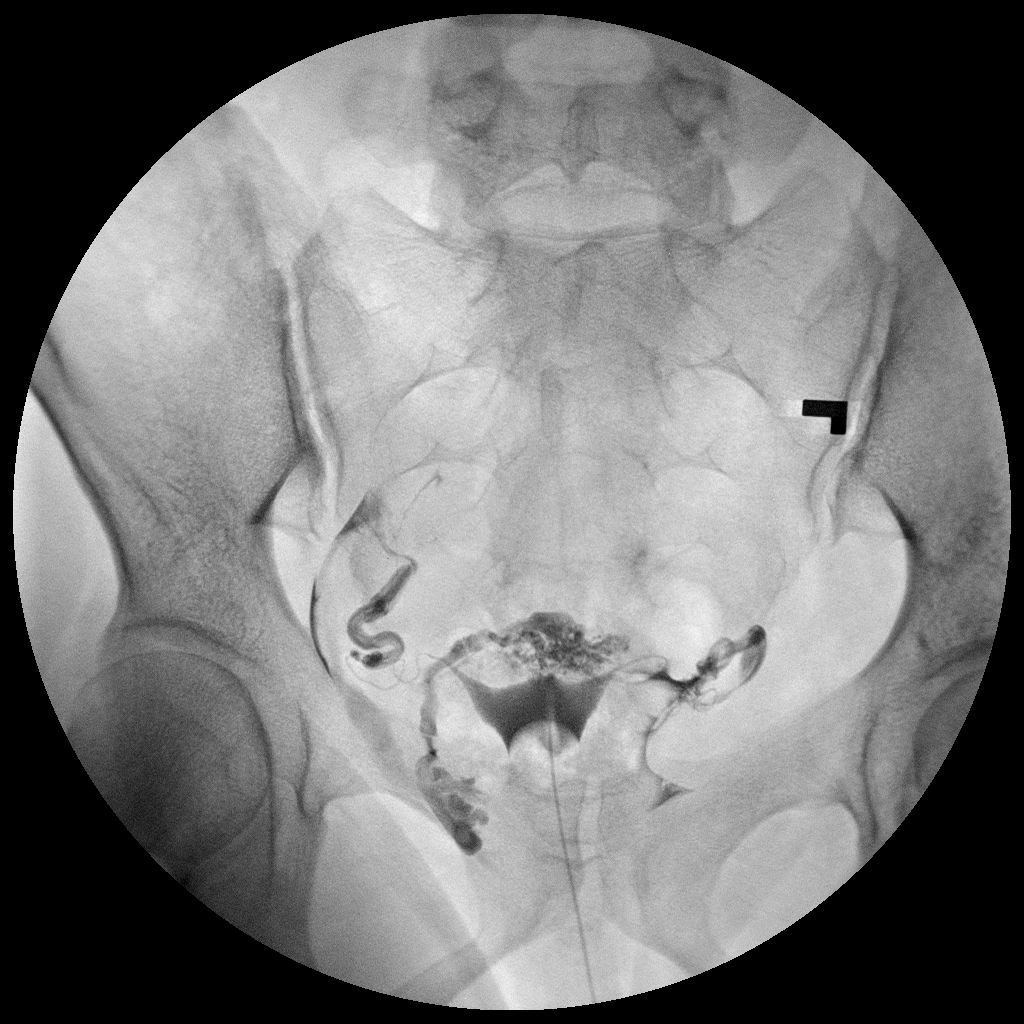

[Series 3: run · 1 of 1 slices shown (3 of 4)]
[im 1/1]
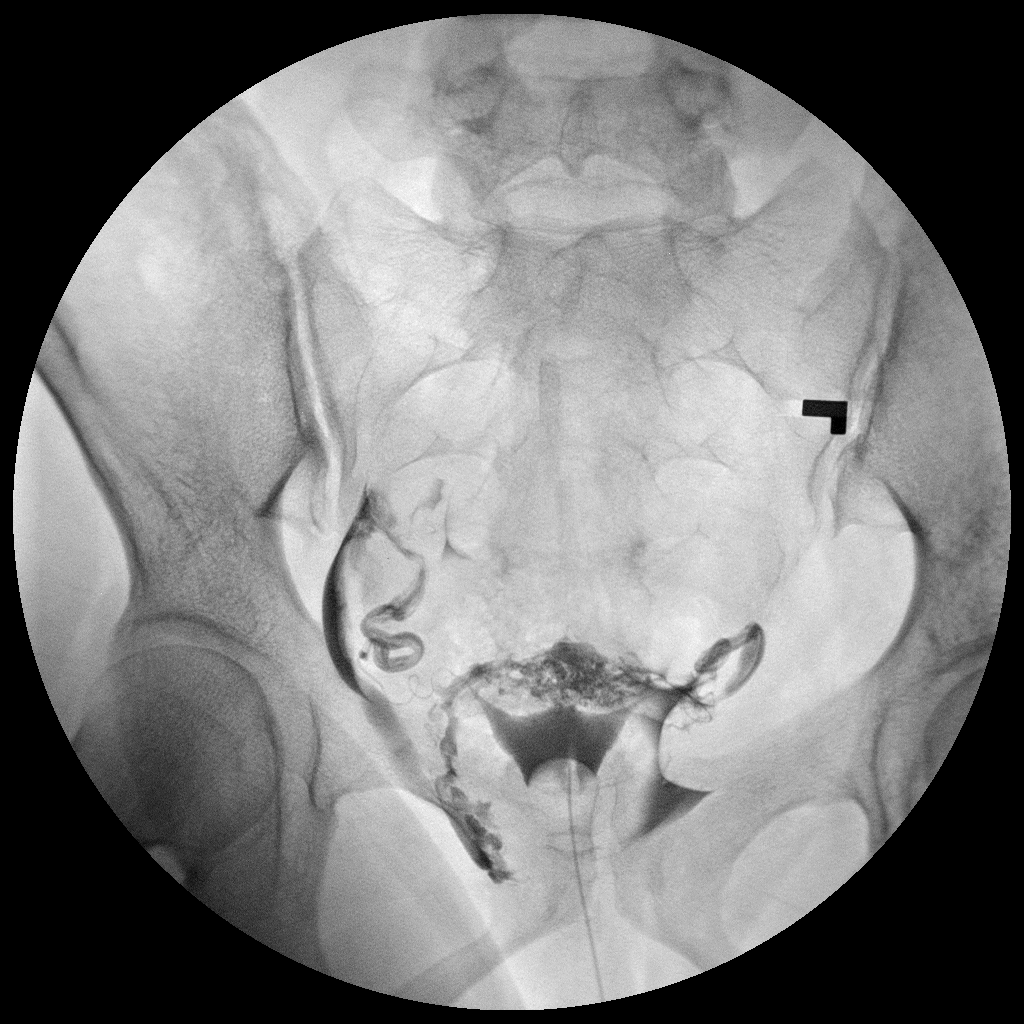

[Series 4: run · 1 of 1 slices shown (4 of 4)]
[im 1/1]
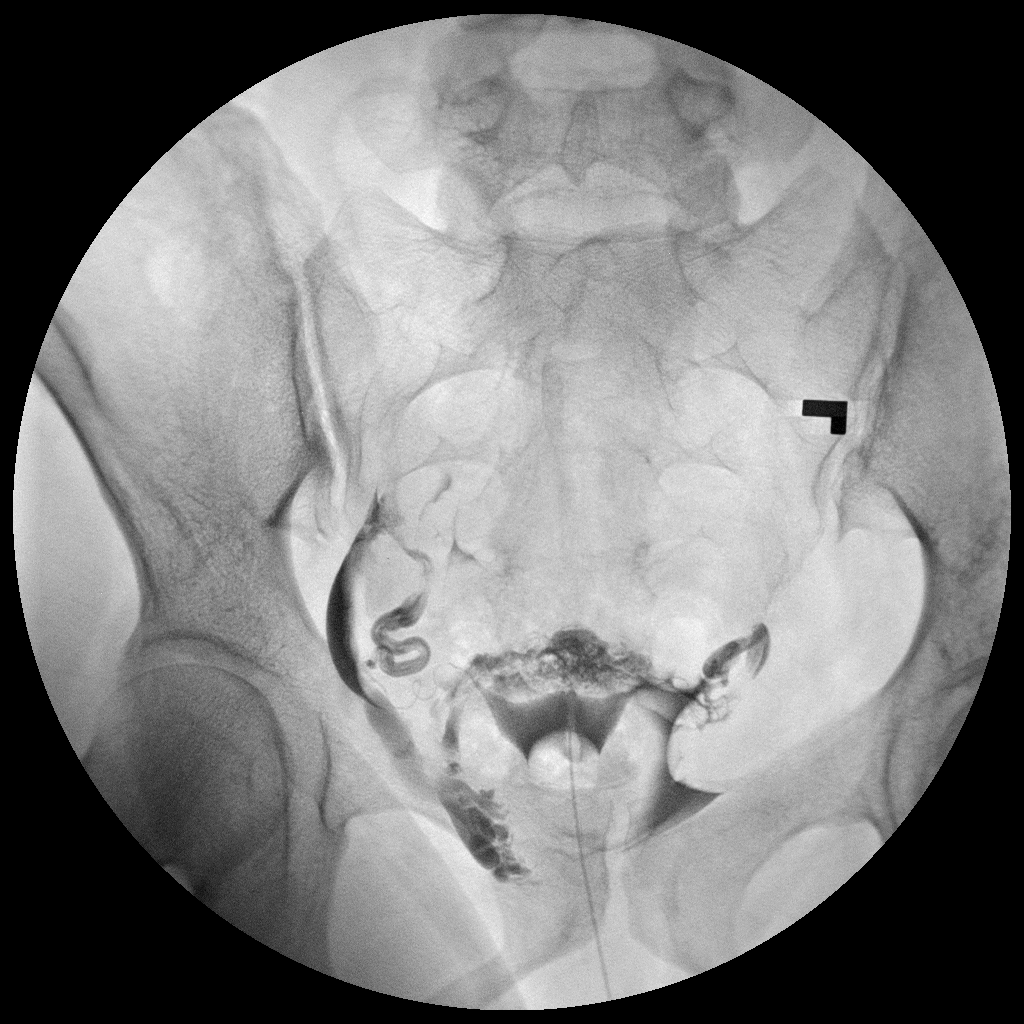

[4 of 4 positions shown; findings below may reference images not displayed]

FLUOROSCOPY TIME:  Radiation Exposure Index (as provided by the
fluoroscopic device):

If the device does not provide the exposure index:

Fluoroscopy Time:  30 seconds

Number of Acquired Images:  4
FINDINGS: Endometrial cavity is normal. Both fallopian tubes fill and have a
normal appearance. Normal spillage bilaterally.
IMPRESSION: Normal study.

## 2016-12-11 ENCOUNTER — Encounter (HOSPITAL_COMMUNITY): Payer: Self-pay

## 2016-12-26 MED FILL — NORETHINDRONE 0.35 MG TAB: 0.35 | 28 days supply | Qty: 28 | Fill #1

## 2017-01-17 MED FILL — NORETHINDRONE 0.35 MG TAB: 0.35 | 28 days supply | Qty: 28 | Fill #2

## 2017-02-18 MED FILL — CITRANATAL 90 DHA COMBO PAC: 90-1 & 300 | 30 days supply | Qty: 60 | Fill #0

## 2017-02-18 MED FILL — NORETHINDRONE 0.35 MG TAB: 0.35 | 28 days supply | Qty: 28 | Fill #3

## 2017-03-20 MED FILL — NORETHINDRONE 0.35 MG TAB: 0.35 | 28 days supply | Qty: 28 | Fill #4

## 2017-04-10 MED FILL — NORETHINDRONE 0.35 MG TAB: 0.35 | 28 days supply | Qty: 28 | Fill #5

## 2017-05-08 MED FILL — BLISOVI 24 FE TABLET: 1-20 | 28 days supply | Qty: 28 | Fill #0

## 2017-06-05 MED FILL — BLISOVI 24 FE TABLET: 1-20 | 28 days supply | Qty: 28 | Fill #1

## 2017-06-30 MED FILL — BLISOVI 24 FE TABLET: 1-20 | 84 days supply | Qty: 84 | Fill #0

## 2017-09-22 MED FILL — BLISOVI 24 FE TABLET: 1-20 | 84 days supply | Qty: 84 | Fill #1

## 2017-12-16 MED FILL — BLISOVI 24 FE TABLET: 1-20 | 84 days supply | Qty: 84 | Fill #2

## 2018-03-09 MED FILL — LARIN 24 FE 1 MG-20 MCG TAB: 1-20 | 84 days supply | Qty: 84 | Fill #3

## 2018-03-12 ENCOUNTER — Other Ambulatory Visit: Payer: Self-pay | Admitting: Obstetrics & Gynecology

## 2018-03-12 DIAGNOSIS — N941 Unspecified dyspareunia: Secondary | ICD-10-CM

## 2018-03-12 DIAGNOSIS — R319 Hematuria, unspecified: Secondary | ICD-10-CM

## 2018-03-19 ENCOUNTER — Other Ambulatory Visit: Payer: Self-pay

## 2018-03-27 ENCOUNTER — Other Ambulatory Visit: Payer: Self-pay

## 2018-06-08 MED FILL — BLISOVI 24 FE 1-20 MG-MCG(2: 1-20 | 84 days supply | Qty: 84 | Fill #4

## 2019-03-05 NOTE — L&D Delivery Note (Signed)
[facility] Delivery Note   Patient Name: Chelsea Ballard DOB: April 24, 1985 MRN: [id number]  Date of admission: 09/26/2019 Delivering MD: Dale [place]  Date of delivery: 09/27/19 Type of delivery: SVD  Newborn Data: Live born female  Birth Weight:   APGAR: Pending by NICU  Newborn Delivery   Birth date/time: 09/27/2019 02:56:00 Delivery type: Vaginal, Spontaneous     [name], 34 y.o., @ [redacted]w[redacted]d,  G2P1001, who was admitted for PPROM at 2130 on 7/26, gbs-, at 34.5 weeks, received x1 dose of BMZ on 7/26 @ 0000. I was called to the room when pt was 6cm and a prolonged decel was noted, RN informed me she placed an FSE, 3 min prolonged to nadir of 60s, pt on hands and knees, resolved for 10 sec then has variable with pressure now 10cm urge to push when she progressed +2 station in the second stage of labor.  She pushed for 1/min. NICU called and present for delivery. She delivered a viable infant, cephalic and restituted to the ROA position over an intact perineum.  A nuchal cord   was identified, tight and unable to reduce, newborn somersaulted through and tolerated well. The baby was placed on maternal abdomen while initial step of NRP were perfmored (Dry, Stimulated, and warmed). Hat placed on baby for thermoregulation. Delayed cord clamping was performed for 1 minutes.  Cord double clamped and cut.  Cord cut by father. Apgar scores were done by NICU, see NICU note for further detail, newborn was vigorous at birth, then started grunting and having sternal retraction. Prophylactic Pitocin was started in the third stage of labor for active management. The placenta delivered spontaneously, shultz, with a 3 vessel cord and was sent to LD. An examination of the vaginal vault and cervix was free from lacerations. The uterus was firm, bleeding stable.  Placenta sent to pathology and umbilical artery blood gas were not sent.  There were no complications during the procedure. Left in stable  condition. Baby taken with father to couplet care with NICU team, utilizing CPAP on during transport.   Maternal Info: Anesthesia: Natural Episiotomy: NO Lacerations:  NO Suture Repair: NO Est. Blood Loss (mL):   Newborn Info:  Baby Sex: female Circumcision: NO    Mom to postpartum.  Baby to Couplet care / Skin to Skin.  Dr Sallye Ober aware.   [place], [place], NP-C 09/27/19 3:15 AM

## 2019-03-11 LAB — OB RESULTS CONSOLE RPR: RPR: NONREACTIVE

## 2019-03-11 LAB — OB RESULTS CONSOLE RUBELLA ANTIBODY, IGM: Rubella: IMMUNE

## 2019-03-11 LAB — OB RESULTS CONSOLE HEPATITIS B SURFACE ANTIGEN: Hepatitis B Surface Ag: NEGATIVE

## 2019-03-11 LAB — OB RESULTS CONSOLE HIV ANTIBODY (ROUTINE TESTING): HIV: NONREACTIVE

## 2019-03-19 ENCOUNTER — Other Ambulatory Visit: Payer: Self-pay | Admitting: Obstetrics & Gynecology

## 2019-03-19 DIAGNOSIS — N941 Unspecified dyspareunia: Secondary | ICD-10-CM

## 2019-03-19 DIAGNOSIS — R319 Hematuria, unspecified: Secondary | ICD-10-CM

## 2019-03-20 DIAGNOSIS — Z3A01 Less than 8 weeks gestation of pregnancy: Secondary | ICD-10-CM | POA: Insufficient documentation

## 2019-03-20 DIAGNOSIS — R1013 Epigastric pain: Secondary | ICD-10-CM | POA: Insufficient documentation

## 2019-03-20 DIAGNOSIS — R110 Nausea: Secondary | ICD-10-CM | POA: Diagnosis not present

## 2019-03-20 DIAGNOSIS — O26891 Other specified pregnancy related conditions, first trimester: Secondary | ICD-10-CM | POA: Diagnosis not present

## 2019-03-20 DIAGNOSIS — Z5321 Procedure and treatment not carried out due to patient leaving prior to being seen by health care provider: Secondary | ICD-10-CM | POA: Diagnosis not present

## 2019-03-21 ENCOUNTER — Emergency Department (HOSPITAL_COMMUNITY)
Admission: EM | Admit: 2019-03-21 | Discharge: 2019-03-21 | Disposition: A | Discharge status: Home / Self Care | Payer: Medicaid Other | Attending: Emergency Medicine | Admitting: Emergency Medicine

## 2019-03-21 ENCOUNTER — Encounter (HOSPITAL_COMMUNITY): Payer: Self-pay | Admitting: *Deleted

## 2019-03-21 ENCOUNTER — Other Ambulatory Visit: Payer: Self-pay

## 2019-03-21 LAB — COMPREHENSIVE METABOLIC PANEL
ALT: 17 U/L (ref 0–44)
AST: 18 U/L (ref 15–41)
Albumin: 3.7 g/dL (ref 3.5–5.0)
Alkaline Phosphatase: 53 U/L (ref 38–126)
Anion gap: 9 (ref 5–15)
BUN: 6 mg/dL (ref 6–20)
CO2: 23 mmol/L (ref 22–32)
Calcium: 9.2 mg/dL (ref 8.9–10.3)
Chloride: 103 mmol/L (ref 98–111)
Creatinine, Ser: 0.51 mg/dL (ref 0.44–1.00)
GFR calc Af Amer: >60 mL/min (ref >60)
GFR calc non Af Amer: >60 mL/min (ref >60)
Glucose, Bld: 105 mg/dL — ABNORMAL HIGH (ref 70–99)
Potassium: 4.3 mmol/L (ref 3.5–5.1)
Sodium: 135 mmol/L (ref 135–145)
Total Bilirubin: 0.3 mg/dL (ref 0.3–1.2)
Total Protein: 7 g/dL (ref 6.5–8.1)

## 2019-03-21 LAB — CBC
HCT: 42.4 % (ref 36.0–46.0)
Hemoglobin: 13.6 g/dL (ref 12.0–15.0)
MCH: 29.6 pg (ref 26.0–34.0)
MCHC: 32.1 g/dL (ref 30.0–36.0)
MCV: 92.4 fL (ref 80.0–100.0)
Platelets: 235 10*3/uL (ref 150–400)
RBC: 4.59 MIL/uL (ref 3.87–5.11)
RDW: 12.6 % (ref 11.5–15.5)
WBC: 9.7 10*3/uL (ref 4.0–10.5)
nRBC: 0 % (ref 0.0–0.2)

## 2019-03-21 LAB — URINALYSIS, ROUTINE W REFLEX MICROSCOPIC
Bacteria, UA: NONE SEEN
Bilirubin Urine: NEGATIVE
Glucose, UA: NEGATIVE mg/dL
Hgb urine dipstick: NEGATIVE
Ketones, ur: NEGATIVE mg/dL
Nitrite: NEGATIVE
Protein, ur: NEGATIVE mg/dL
Specific Gravity, Urine: 1.013 (ref 1.005–1.030)
pH: 7 (ref 5.0–8.0)

## 2019-03-21 LAB — I-STAT BETA HCG BLOOD, ED (MC, WL, AP ONLY): I-stat hCG, quantitative: >2000 m[IU]/mL — ABNORMAL HIGH (ref <5)

## 2019-03-21 LAB — LIPASE, BLOOD: Lipase: 31 U/L (ref 11–51)

## 2019-03-21 MED ORDER — SODIUM CHLORIDE 0.9% FLUSH: 3.0000 mL | Freq: Once | INTRAVENOUS | Status: DC

## 2019-03-21 NOTE — ED Triage Notes (Signed)
The pt is [redacted] weeks pregnant she hass been having epigastric pain  Since last night  No vaginal bleeding.  lmp nov  Some nausea

## 2019-03-25 ENCOUNTER — Inpatient Hospital Stay: Admission: RE | Admit: 2019-03-25 | Payer: Medicaid Other | Source: Ambulatory Visit

## 2019-03-26 ENCOUNTER — Other Ambulatory Visit: Payer: Medicaid Other

## 2019-09-01 ENCOUNTER — Encounter: Payer: Medicaid Other | Attending: Obstetrics & Gynecology | Admitting: Registered"

## 2019-09-01 ENCOUNTER — Other Ambulatory Visit: Payer: Self-pay

## 2019-09-01 ENCOUNTER — Encounter: Payer: Self-pay | Admitting: Registered"

## 2019-09-01 DIAGNOSIS — O99810 Abnormal glucose complicating pregnancy: Secondary | ICD-10-CM | POA: Diagnosis present

## 2019-09-01 NOTE — Progress Notes (Signed)
Patient was seen on 09/01/19 for Gestational Diabetes self-management class at the [facility]. The following learning objectives were met by the patient during this course:  Patient states her doctor's office told her that the visit would take less than an hour and she had to leave early to make it to another doctors appointment. Patient learned to use the glucose monitor provided by [facility] but was not able to stay much longer than that. Did not get any education on carb counting and will be scheduled for a follow-up visit.   States when to check blood glucose levels  Demonstrates proper blood glucose monitoring techniques  Blood glucose monitor given: Accu-chek Guide Me Lot #[id number] Exp: 10/21/2020 Blood Glucose: 83 mg/dL  Patient instructed to monitor glucose levels: FBS: 60 - <95; 1 hour: <140; 2 hour: <120  Patient received handouts:  Nutrition Diabetes and Pregnancy, including carb counting list  Patient will be seen for follow-up as needed.

## 2019-09-07 ENCOUNTER — Other Ambulatory Visit: Payer: Self-pay

## 2019-09-07 ENCOUNTER — Encounter: Payer: Medicaid Other | Attending: Obstetrics & Gynecology | Admitting: Registered"

## 2019-09-07 ENCOUNTER — Ambulatory Visit: Payer: Medicaid Other | Admitting: Registered"

## 2019-09-07 DIAGNOSIS — O99810 Abnormal glucose complicating pregnancy: Secondary | ICD-10-CM | POA: Insufficient documentation

## 2019-09-07 NOTE — Patient Instructions (Addendum)
Keep up the good changes made so far eating less rice and sweets. Continue to eat plenty of greens. Consider using your WIC benefits at [employer].  To reduce your fasting number, continue to walk daily and consider trying a small snack before bed such as 1/2 apple with some almond butter.  If having milk, considering having with meal or at least be sure to include a protein. For breakfast include more protein. If having bread, include almond butter. Include more beans in your diet to help satisfy as you are reducing rice. When eating grains, aim for whole grains.

## 2019-09-07 NOTE — Progress Notes (Signed)
Patient was seen on 09/07/19 for follow-up assessment and education for Gestational Diabetes. EDD 11/03/19. Patient states changes to diet/lifestyle including reduced rice and sweet intake   Patient is testing blood glucose as directed pre breakfast and 2 hours after each meal. Review of Log Book shows: FBS: 3/6 above 90 mg/dL ([facility] FBS target max is 90). Post prandial: 1/6 each meal above 120 mg/dL with the highest at 161 mg/dL.  Patient had concern that she was charged for lancets that were not the right ones for the meter she had. RD called pharmacy and issue was resolved. Pharmacist said patient could return to the store for a refund.  The following learning objectives reviewed during follow-up visit:   Ways to reduce FBS  Ways to increase protein and replace rice  Plan:  To reduce your fasting number, continue to walk daily and consider trying a small snack before bed such as 1/2 apple with some almond butter.  If having milk, considering having with meal or at least be sure to include a protein. For breakfast include more protein. If having bread, include almond butter. Include more beans in your diet to help satisfy as you are reducing rice. When eating grains, aim for whole grains.  Patient instructed to monitor glucose levels: FBS: 60 - 95 mg/dl 2 hour: <096 mg/dl  Patient received the following handouts:  [employer] accepting [facility] benefits  Patient will be seen for follow-up in as needed.

## 2019-09-14 ENCOUNTER — Other Ambulatory Visit: Payer: Self-pay

## 2019-09-26 ENCOUNTER — Inpatient Hospital Stay (HOSPITAL_COMMUNITY)
Admission: AD | Admit: 2019-09-26 | Discharge: 2019-09-29 | DRG: 807 | Disposition: A | Discharge status: Home / Self Care | Payer: Medicaid Other | Attending: Obstetrics & Gynecology | Admitting: Obstetrics & Gynecology

## 2019-09-26 ENCOUNTER — Encounter (HOSPITAL_COMMUNITY): Payer: Self-pay | Admitting: Obstetrics and Gynecology

## 2019-09-26 DIAGNOSIS — Z20822 Contact with and (suspected) exposure to covid-19: Secondary | ICD-10-CM | POA: Diagnosis present

## 2019-09-26 DIAGNOSIS — O42919 Preterm premature rupture of membranes, unspecified as to length of time between rupture and onset of labor, unspecified trimester: Secondary | ICD-10-CM

## 2019-09-26 DIAGNOSIS — O24420 Gestational diabetes mellitus in childbirth, diet controlled: Secondary | ICD-10-CM | POA: Diagnosis present

## 2019-09-26 DIAGNOSIS — Z3A34 34 weeks gestation of pregnancy: Secondary | ICD-10-CM

## 2019-09-26 DIAGNOSIS — O42913 Preterm premature rupture of membranes, unspecified as to length of time between rupture and onset of labor, third trimester: Principal | ICD-10-CM | POA: Diagnosis present

## 2019-09-26 HISTORY — DX: Gestational diabetes mellitus in pregnancy, unspecified control: O24.419

## 2019-09-26 LAB — WET PREP, GENITAL
Clue Cells Wet Prep HPF POC: NONE SEEN
Sperm: NONE SEEN
Trich, Wet Prep: NONE SEEN
Yeast Wet Prep HPF POC: NONE SEEN

## 2019-09-26 LAB — POCT FERN TEST: POCT Fern Test: POSITIVE

## 2019-09-26 MED ORDER — BETAMETHASONE SOD PHOS & ACET 6 (3-3) MG/ML IJ SUSP
12.0000 mg | INTRAMUSCULAR | Status: DC
  Administered 2019-09-27: 12 mg via INTRAMUSCULAR
  Filled 2019-09-26: qty 5

## 2019-09-26 NOTE — MAU Note (Signed)
.  Chelsea Ballard is a 34 y.o. in MAU reporting: a gush of clear fluid at 2125. Pt reports irregular ctx. +FM. No vaginal bleeding.   Onset of complaint: 2125 Pain score: 5/10 Vitals:   09/26/19 2301  BP: 114/72  Pulse: 88  Resp: 17  Temp: 98.2 F (36.8 C)  SpO2: 100%

## 2019-09-26 NOTE — MAU Provider Note (Signed)
First Provider Initiated Contact with Patient 09/26/19 2324       S: Ms. Chelsea Ballard is a 34 y.o. G3P1010 at [redacted]w[redacted]d  who presents to MAU today complaining of leaking of fluid since 2125. She denies vaginal bleeding. She endorses contractions that she reports are more tightening than painful. She reports normal fetal movement.    O: BP 114/72 (BP Location: Left Arm)   Pulse 88   Temp 98.2 F (36.8 C) (Oral)   Resp 17   LMP 01/19/2019   SpO2 100%  GENERAL: Well-developed, well-nourished female in no acute distress.  HEAD: Normocephalic, atraumatic.  CHEST: Normal effort of breathing, regular heart rate ABDOMEN: Soft, nontender, gravid PELVIC: Normal external female genitalia. Clear fluid actively leaking from introitus.    Cervical exam: digital exam deferred Presentation: Vertex (by Bedside US) Exam by:: Judeth Horn, RN    Fetal Monitoring: Baseline: 130 Variability: moderate Accelerations: 15x15 Decelerations: none Contractions: Q4-7 minutes  Results for orders placed or performed during the hospital encounter of 09/26/19 (from the past 24 hour(s))  Fern Test     Status: Abnormal   Collection Time: 09/26/19 11:21 PM  Result Value Ref Range   POCT Fern Test Positive = ruptured amniotic membanes    MDM Grossly rupture clear fluid that ferns positive. Spec exam deferred.  Wet prep, GC/CT, & GBS culture collected  Pt informed that the ultrasound is considered a limited OB ultrasound and is not intended to be a complete ultrasound exam.  Patient also informed that the ultrasound is not being completed with the intent of assessing for fetal or placental anomalies or any pelvic abnormalities.  Explained that the purpose of today's ultrasound is to assess for  presentation.  Patient acknowledges the purpose of the exam and the limitations of the study.  cephalic    A: 1. Preterm premature rupture of membranes (PPROM) with unknown onset of labor   2. [redacted] weeks gestation of  pregnancy     P: [facility] covering for [employer]. [facility] ([facility] midwife) aware of patient & will place admission orders  Judeth Horn, NP 09/26/2019 11:36 PM

## 2019-09-27 ENCOUNTER — Encounter (HOSPITAL_COMMUNITY): Payer: Self-pay | Admitting: Obstetrics & Gynecology

## 2019-09-27 ENCOUNTER — Other Ambulatory Visit: Payer: Self-pay

## 2019-09-27 DIAGNOSIS — O24420 Gestational diabetes mellitus in childbirth, diet controlled: Secondary | ICD-10-CM | POA: Diagnosis present

## 2019-09-27 DIAGNOSIS — O42919 Preterm premature rupture of membranes, unspecified as to length of time between rupture and onset of labor, unspecified trimester: Secondary | ICD-10-CM | POA: Diagnosis present

## 2019-09-27 DIAGNOSIS — O42913 Preterm premature rupture of membranes, unspecified as to length of time between rupture and onset of labor, third trimester: Secondary | ICD-10-CM | POA: Diagnosis present

## 2019-09-27 DIAGNOSIS — Z20822 Contact with and (suspected) exposure to covid-19: Secondary | ICD-10-CM | POA: Diagnosis present

## 2019-09-27 DIAGNOSIS — Z3A34 34 weeks gestation of pregnancy: Secondary | ICD-10-CM | POA: Diagnosis not present

## 2019-09-27 LAB — CBC
HCT: 41.1 % (ref 36.0–46.0)
HCT: 41.4 % (ref 36.0–46.0)
Hemoglobin: 13.7 g/dL (ref 12.0–15.0)
Hemoglobin: 13.7 g/dL (ref 12.0–15.0)
MCH: 29.7 pg (ref 26.0–34.0)
MCH: 30 pg (ref 26.0–34.0)
MCHC: 33.1 g/dL (ref 30.0–36.0)
MCHC: 33.3 g/dL (ref 30.0–36.0)
MCV: 89.6 fL (ref 80.0–100.0)
MCV: 90.1 fL (ref 80.0–100.0)
Platelets: 205 10*3/uL (ref 150–400)
Platelets: 214 10*3/uL (ref 150–400)
RBC: 4.56 MIL/uL (ref 3.87–5.11)
RBC: 4.62 MIL/uL (ref 3.87–5.11)
RDW: 13.1 % (ref 11.5–15.5)
RDW: 13.1 % (ref 11.5–15.5)
WBC: 13.3 10*3/uL — ABNORMAL HIGH (ref 4.0–10.5)
WBC: 7.8 10*3/uL (ref 4.0–10.5)
nRBC: 0 % (ref 0.0–0.2)
nRBC: 0 % (ref 0.0–0.2)

## 2019-09-27 LAB — TYPE AND SCREEN
ABO/RH(D): A POS
Antibody Screen: NEGATIVE

## 2019-09-27 LAB — GLUCOSE, CAPILLARY: Glucose-Capillary: 118 mg/dL — ABNORMAL HIGH (ref 70–99)

## 2019-09-27 LAB — GC/CHLAMYDIA PROBE AMP (~~LOC~~) NOT AT ARMC
Chlamydia: NEGATIVE
Comment: NEGATIVE
Comment: NORMAL
Neisseria Gonorrhea: NEGATIVE

## 2019-09-27 LAB — GROUP B STREP BY PCR: Group B strep by PCR: NEGATIVE

## 2019-09-27 LAB — ABO/RH: ABO/RH(D): A POS

## 2019-09-27 LAB — SARS CORONAVIRUS 2 BY RT PCR (HOSPITAL ORDER, PERFORMED IN ~~LOC~~ HOSPITAL LAB): SARS Coronavirus 2: NEGATIVE

## 2019-09-27 LAB — RPR: RPR Ser Ql: NONREACTIVE

## 2019-09-27 MED ORDER — LIDOCAINE HCL (PF) 1 % IJ SOLN: 30.0000 mL | INTRAMUSCULAR | Status: DC | PRN

## 2019-09-27 MED ORDER — SOD CITRATE-CITRIC ACID 500-334 MG/5ML PO SOLN: 30.0000 mL | ORAL | Status: DC | PRN

## 2019-09-27 MED ORDER — OXYTOCIN-SODIUM CHLORIDE 30-0.9 UT/500ML-% IV SOLN
2.5000 [IU]/h | INTRAVENOUS | Status: DC
  Administered 2019-09-27: 2.5 [IU]/h via INTRAVENOUS
  Filled 2019-09-27: qty 500

## 2019-09-27 MED ORDER — DIBUCAINE (PERIANAL) 1 % EX OINT: 1.0000 "application " | TOPICAL_OINTMENT | CUTANEOUS | Status: DC | PRN

## 2019-09-27 MED ORDER — FENTANYL-BUPIVACAINE-NACL 0.5-0.125-0.9 MG/250ML-% EP SOLN
EPIDURAL | Status: AC
  Filled 2019-09-27: qty 250

## 2019-09-27 MED ORDER — PHENYLEPHRINE 40 MCG/ML (10ML) SYRINGE FOR IV PUSH (FOR BLOOD PRESSURE SUPPORT): 80.0000 ug | PREFILLED_SYRINGE | INTRAVENOUS | Status: DC | PRN

## 2019-09-27 MED ORDER — OXYCODONE HCL 5 MG PO TABS
10.0000 mg | ORAL_TABLET | ORAL | Status: DC | PRN
  Administered 2019-09-28: 10 mg via ORAL
  Filled 2019-09-27: qty 2

## 2019-09-27 MED ORDER — FENTANYL-BUPIVACAINE-NACL 0.5-0.125-0.9 MG/250ML-% EP SOLN: 12.0000 mL/h | EPIDURAL | Status: DC | PRN

## 2019-09-27 MED ORDER — LACTATED RINGERS IV SOLN: 500.0000 mL | INTRAVENOUS | Status: DC | PRN

## 2019-09-27 MED ORDER — EPHEDRINE 5 MG/ML INJ: 10.0000 mg | INTRAVENOUS | Status: DC | PRN

## 2019-09-27 MED ORDER — LACTATED RINGERS IV SOLN: 500.0000 mL | Freq: Once | INTRAVENOUS | Status: DC

## 2019-09-27 MED ORDER — LACTATED RINGERS IV SOLN: INTRAVENOUS | Status: DC

## 2019-09-27 MED ORDER — SIMETHICONE 80 MG PO CHEW: 80.0000 mg | CHEWABLE_TABLET | ORAL | Status: DC | PRN

## 2019-09-27 MED ORDER — ACETAMINOPHEN 325 MG PO TABS: 650.0000 mg | ORAL_TABLET | ORAL | Status: DC | PRN

## 2019-09-27 MED ORDER — ZOLPIDEM TARTRATE 5 MG PO TABS: 5.0000 mg | ORAL_TABLET | Freq: Every evening | ORAL | Status: DC | PRN

## 2019-09-27 MED ORDER — OXYCODONE HCL 5 MG PO TABS
5.0000 mg | ORAL_TABLET | ORAL | Status: DC | PRN
  Administered 2019-09-27 – 2019-09-28 (×3): 5 mg via ORAL
  Filled 2019-09-27 (×3): qty 1

## 2019-09-27 MED ORDER — IBUPROFEN 600 MG PO TABS
600.0000 mg | ORAL_TABLET | Freq: Four times a day (QID) | ORAL | Status: DC
  Administered 2019-09-27 – 2019-09-29 (×10): 600 mg via ORAL
  Filled 2019-09-27 (×10): qty 1

## 2019-09-27 MED ORDER — FENTANYL CITRATE (PF) 100 MCG/2ML IJ SOLN
50.0000 ug | INTRAMUSCULAR | Status: DC | PRN
  Administered 2019-09-27: 100 ug via INTRAVENOUS
  Filled 2019-09-27: qty 2

## 2019-09-27 MED ORDER — TETANUS-DIPHTH-ACELL PERTUSSIS 5-2.5-18.5 LF-MCG/0.5 IM SUSP: 0.5000 mL | Freq: Once | INTRAMUSCULAR | Status: DC

## 2019-09-27 MED ORDER — DIPHENHYDRAMINE HCL 25 MG PO CAPS: 25.0000 mg | ORAL_CAPSULE | Freq: Four times a day (QID) | ORAL | Status: DC | PRN

## 2019-09-27 MED ORDER — BENZOCAINE-MENTHOL 20-0.5 % EX AERO: 1.0000 "application " | INHALATION_SPRAY | CUTANEOUS | Status: DC | PRN

## 2019-09-27 MED ORDER — ONDANSETRON HCL 4 MG PO TABS: 4.0000 mg | ORAL_TABLET | ORAL | Status: DC | PRN

## 2019-09-27 MED ORDER — ONDANSETRON HCL 4 MG/2ML IJ SOLN: 4.0000 mg | Freq: Four times a day (QID) | INTRAMUSCULAR | Status: DC | PRN

## 2019-09-27 MED ORDER — DIPHENHYDRAMINE HCL 50 MG/ML IJ SOLN: 12.5000 mg | INTRAMUSCULAR | Status: DC | PRN

## 2019-09-27 MED ORDER — PRENATAL MULTIVITAMIN CH
1.0000 | ORAL_TABLET | Freq: Every day | ORAL | Status: DC
  Administered 2019-09-27 – 2019-09-29 (×3): 1 via ORAL
  Filled 2019-09-27 (×3): qty 1

## 2019-09-27 MED ORDER — WITCH HAZEL-GLYCERIN EX PADS: 1.0000 "application " | MEDICATED_PAD | CUTANEOUS | Status: DC | PRN

## 2019-09-27 MED ORDER — OXYCODONE-ACETAMINOPHEN 5-325 MG PO TABS
1.0000 | ORAL_TABLET | ORAL | Status: DC | PRN
  Administered 2019-09-27: 1 via ORAL
  Filled 2019-09-27: qty 1

## 2019-09-27 MED ORDER — OXYTOCIN BOLUS FROM INFUSION
333.0000 mL | Freq: Once | INTRAVENOUS | Status: AC
  Administered 2019-09-27: 333 mL via INTRAVENOUS

## 2019-09-27 MED ORDER — TERBUTALINE SULFATE 1 MG/ML IJ SOLN
INTRAMUSCULAR | Status: AC
  Filled 2019-09-27: qty 1

## 2019-09-27 MED ORDER — SENNOSIDES-DOCUSATE SODIUM 8.6-50 MG PO TABS
2.0000 | ORAL_TABLET | ORAL | Status: DC
  Administered 2019-09-28 – 2019-09-29 (×2): 2 via ORAL
  Filled 2019-09-27 (×2): qty 2

## 2019-09-27 MED ORDER — OXYCODONE-ACETAMINOPHEN 5-325 MG PO TABS: 2.0000 | ORAL_TABLET | ORAL | Status: DC | PRN

## 2019-09-27 MED ORDER — ONDANSETRON HCL 4 MG/2ML IJ SOLN: 4.0000 mg | INTRAMUSCULAR | Status: DC | PRN

## 2019-09-27 MED ORDER — COCONUT OIL OIL: 1.0000 "application " | TOPICAL_OIL | Status: DC | PRN

## 2019-09-27 NOTE — Consult Note (Signed)
Delivery Note:  SVD    09/27/2019  3:17 AM  I was called to the delivery room at the request of the patient's obstetrician (Dr. Ozan) for preterm delivery at 34 weeks.  PRENATAL HX:  This is a 34 y/o G3P1011 at 34 and 5/[redacted] weeks gestation who was admitted for PROM followed by precipitous delivery.  Her pregnancy has also been complicated by GDM, diet controlled.  She has received the first dose of BMZ.  ROM x6 hours, GBS negative.    DELIVERY:  Infant was vigorous at delivery, initially requiring no resuscitation other than standard warming, drying and stimulation.  APGARs 8 and 9.  O2 saturations in low 90s at 4 minutes but CPAP initiated at around 5 minutes of age for grunting and retractions.  He was admitted to the NICU on CPAP +5, 21%.     _____________________ Electronically Signed By: Joseph Bias, MD Neonatologist  

## 2019-09-27 NOTE — Consult Note (Signed)
[facility]  Prenatal Consult       09/27/2019  12:52 AM   I was asked by Dr. Charlotta Newton to consult on this patient for possible preterm delivery.  I had the pleasure of meeting with Chelsea Ballard today.  She is a 34 y/o G3P1011 at 87 and 5/[redacted] weeks gestation who was admitted for PPROM and early labor.  Her pregnancy has also been complicated by GDM, diet controlled.  She has received the first dose of BMZ.    I explained that the neonatal intensive care team would be present for the delivery and outlined the likely delivery room course.  We discussed other common problems associated with prematurity including respiratory distress syndrome/CLD, apnea, feeding issues, temperature regulation, and infection risk.     We discussed the average length of stay but I noted that the actual LOS would depend on the severity of problems encountered and response to treatments.  We discussed visitation policies and the resources available while her child is in the hospital.  We discussed the importance of good nutrition and various methods of providing nutrition (parenteral hyperalimentation, gavage feedings and/or oral feeding). We discussed the benefits of human milk.  Thank you for involving [place] in the care of this patient. A member of our team will be available should the family have additional questions.  Time for consultation approximately 30 minutes.   _____________________ Electronically Signed By: Maryan Char, MD Neonatologist

## 2019-09-27 NOTE — Progress Notes (Signed)
Post Partum Day 0 Subjective: no complaints, up ad lib, voiding and tolerating PO  Objective: Blood pressure (!) 96/50, pulse 83, temperature 97.8 F (36.6 C), temperature source Oral, resp. rate 16, height 5\' 1"  (1.549 m), weight 71.7 kg, last menstrual period 01/19/2019, SpO2 99 %, unknown if currently breastfeeding.  Physical Exam:  General: alert, cooperative and no distress Lochia: appropriate Uterine Fundus: firm Incision: NA DVT Evaluation: No evidence of DVT seen on physical exam.  Recent Labs    09/27/19 0002 09/27/19 0738  HGB 13.7 13.7  HCT 41.1 41.4    Assessment/Plan: Pt doing well  Routine postpartum care    LOS: 0 days   Chelsea Ballard 09/27/2019, 5:57 PM

## 2019-09-27 NOTE — Lactation Note (Signed)
This note was copied from a baby's chart. Lactation Consultation Note  Patient Name: Chelsea Ballard [id number] Date: 09/27/2019 Reason for consult: Initial assessment;NICU baby;Infant < 6lbs;Late-preterm 34-36.6wks GDM, diet controlled  LC in to visit with P2 Mom of preterm infant in the NICU.  Baby 14 hrs old.  RN set Mom up with DEBP earlier today.  Mom has pumped twice so far.  Reviewed importance of frequent double pumping and reviewed breast massage and hand expression.  Reviewed importance of disassembling pump parts, washing, rinsing and air drying parts in separate bin provided.  Mom denies breast size changes with her pregnancy, but breasts were sore in early pregnancy.  Mom tried to breastfeed her first baby (3 yrs old) but had difficulty and used a hand pump occasionally.  Mom states one time she expressed 2 oz.  Unsure whether Mom has a history of low milk supply.  Breasts look slightly wide spaced.   Mom aware of IP and OP lactation support available to her.  Lactation brochure provided.  Mom to ask for help prn.   Interventions Interventions: Breast feeding basics reviewed;Skin to skin;Breast massage;Hand express;DEBP  Lactation Tools Discussed/Used Tools: Pump Breast pump type: Double-Electric Breast Pump WIC Program: Yes Pump Review: Setup, frequency, and cleaning;Milk Storage Initiated by:: RN Date initiated:: 09/27/19   Consult Status Consult Status: Follow-up Date: 09/28/19 Follow-up type: In-patient    [name] 09/27/2019, 5:02 PM

## 2019-09-27 NOTE — H&P (Addendum)
Chelsea Ballard is a 34 y.o. female, G3P1011, IUP at 34.5 weeks, presenting for PPROM, clear around 2130 @ 7/26 with early labor and cxt Q3-16mins, but tolerates well. Pt of Dr Charlotta Newton with [facility]. No prenantal record on hand but per pt anatomy scan at 20 weeks grossly unremarkable. Genetic screening negative. Baby female no circ. Labs all negative per pt. GBS unknown. EFW 4.7lbs on 7/20, dx with GDMA1 diet controlled. One prior vaginal delivery at 37 weeks, pelvis proven to 5.11lbs.  Pt endorse + Fm. Denies vaginal bleeding.   Patient Active Problem List   Diagnosis Date Noted  . Preterm premature rupture of membranes (PPROM) delivered, current hospitalization 09/27/2019  . Abnormal glucose tolerance test (GTT) during pregnancy, antepartum 09/01/2019  . HEMORRHOIDS 05/15/2010  . CONSTIPATION 05/15/2010  . 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 ACID REFLUX DISEASE 03/23/2010  . FOLLICULITIS 03/23/2010  . ACUTE PHARYNGITIS 04/06/2007  . CONTACT DERMATITIS 04/06/2007     Medications Prior to Admission  Medication Sig Dispense Refill Last Dose  . Prenatal Multivit-Min-Fe-FA (PRE-NATAL PO) Take by mouth.   09/26/2019 at Unknown time  . ibuprofen (ADVIL,MOTRIN) 600 MG tablet Take 1 tablet (600 mg total) by mouth every 6 (six) hours as needed. Do not use after [redacted] weeks gestation 12 tablet 0 Unknown at Unknown time  . ondansetron (ZOFRAN ODT) 8 MG disintegrating tablet 8mg  ODT q4 hours prn nausea 4 tablet 0     Past Medical History:  Diagnosis Date  . Migraine      No current facility-administered medications on file prior to encounter.   Current Outpatient Medications on File Prior to Encounter  Medication Sig Dispense Refill  . Prenatal Multivit-Min-Fe-FA (PRE-NATAL PO) Take by mouth.    ibuprofen (ADVIL,MOTRIN) 600 MG tablet Take 1 tablet (600 mg total) by mouth every 6 (six) hours as needed. Do not use after [redacted] weeks gestation 12 tablet 0  . ondansetron (ZOFRAN ODT) 8 MG  disintegrating tablet 8mg  ODT q4 hours prn nausea 4 tablet 0     Allergies  Allergen Reactions  . Peanut-Containing Drug Products Rash   OB History    Gravida  3   Para  1   Term  1   Preterm  0   AB  1   Living  1     SAB  1   TAB      Ectopic      Multiple      Live Births             Past Medical History:  Diagnosis Date  . Migraine    Past Surgical History:  Procedure Laterality Date  . TONSILLECTOMY     Family History: family history is not on file. Social History:  reports that she has never smoked. She has never used smokeless tobacco. She reports that she does not drink alcohol and does not use drugs.   Prenatal Transfer Tool  Maternal Diabetes: Yes:  Diabetes Type:  Diet controlled Genetic Screening: Normal Maternal Ultrasounds/Referrals: Normal Fetal Ultrasounds or other Referrals:  None Maternal Substance Abuse:  No Significant Maternal Medications:  None Significant Maternal Lab Results: Other: GBS unknown, pending now   ROS:  Review of Systems  Constitutional: Negative.   HENT: Negative.   Eyes: Negative.   Respiratory: Negative.   Cardiovascular: Negative.   Gastrointestinal: Positive for abdominal pain.  Genitourinary:       Leakage of fluid  Musculoskeletal: Negative.   Skin: Negative.  Neurological: Negative.   Endo/Heme/Allergies: Negative.   Psychiatric/Behavioral: Negative.      Physical Exam: BP 114/72 (BP Location: Left Arm)   Pulse 88   Temp 98.2 F (36.8 C) (Oral)   Resp 17   LMP 01/19/2019   SpO2 100%   Physical Exam   NST: FHR baseline 130 bpm, Variability: moderate, Accelerations:present, Decelerations:  Absent= Cat 1/Reactive UC:   irregular, every 3-6 minutes, lasting 40 seconds SVE:   Dilation: 3 Effacement (%): 80 Station: -2 Exam by:: [facility], CNM, vertex verified by fetal sutures.  Leopold's: Position vertex, EFW 4.8lbs via leopold's.  Pelvic proven to 5.11lbs  Labs: Results for orders  placed or performed during the hospital encounter of 09/26/19 (from the past 24 hour(s))  Fern Test     Status: Abnormal   Collection Time: 09/26/19 11:21 PM  Result Value Ref Range   POCT Fern Test Positive = ruptured amniotic membanes   Wet prep, genital     Status: Abnormal   Collection Time: 09/26/19 11:33 PM  Result Value Ref Range   Yeast Wet Prep HPF POC NONE SEEN NONE SEEN   Trich, Wet Prep NONE SEEN NONE SEEN   Clue Cells Wet Prep HPF POC NONE SEEN NONE SEEN   WBC, Wet Prep HPF POC MANY (A) NONE SEEN   Sperm NONE SEEN   CBC     Status: None   Collection Time: 09/27/19 12:02 AM  Result Value Ref Range   WBC 7.8 4.0 - 10.5 K/uL   RBC 4.56 3.87 - 5.11 MIL/uL   Hemoglobin 13.7 12.0 - 15.0 g/dL   HCT 07.3 36 - 46 %   MCV 90.1 80.0 - 100.0 fL   MCH 30.0 26.0 - 34.0 pg   MCHC 33.3 30.0 - 36.0 g/dL   RDW 71.0 62.6 - 94.8 %   Platelets 214 150 - 400 K/uL   nRBC 0.0 0.0 - 0.2 %  Type and screen [facility]     Status: None (Preliminary result)   Collection Time: 09/27/19 12:02 AM  Result Value Ref Range   ABO/RH(D) PENDING    Antibody Screen PENDING    Sample Expiration      09/30/2019,2359 Performed at [facility], [address] [place], [place] [address]     Imaging:  No results found.  MAU Course: Orders Placed This Encounter  Procedures  . Wet prep, genital  . Culture, beta strep (group b only)  . SARS Coronavirus 2 by RT PCR (hospital order, performed in [facility] hospital lab) Nasopharyngeal Nasopharyngeal Swab  . Group B strep by PCR  . CBC  . RPR  . Diet clear liquid Room service appropriate? Yes; Fluid consistency: Thin  . Vitals signs per unit policy  . Notify Physician  . Fetal monitoring per unit policy  . Activity as tolerated  . Cervical Exam  . Measure blood pressure post delivery every 15 min x 1 hour then every 30 min x 1 hour  . Fundal check post delivery every 15 min x 1 hour then every 30 min x 1 hour  . If Rapid  HIV test positive or known HIV positive: initiate AZT orders  . May in and out cath x 2 for inability to void  . Insert foley catheter  . Discontinue foley prior to vaginal delivery  . Initiate Carrier Fluid Protocol  . Initiate Oral Care Protocol  . Order Rapid HIV per protocol if no results on chart  .  Patient may have epidural placement upon request  . Full code  . Fern Test  . Type and screen [facility]  . ABO/Rh  . Insert and maintain IV Line  . Admit to Inpatient (patient's expected length of stay will be greater than 2 midnights or inpatient only procedure)   Meds ordered this encounter  Medications  . lactated ringers infusion  . oxytocin (PITOCIN) IV BOLUS FROM BAG  . oxytocin (PITOCIN) IV infusion 30 units in NS 500 mL - Premix  . lactated ringers infusion 500-1,000 mL  . acetaminophen (TYLENOL) tablet 650 mg  . oxyCODONE-acetaminophen (PERCOCET/ROXICET) 5-325 MG per tablet 1 tablet  . oxyCODONE-acetaminophen (PERCOCET/ROXICET) 5-325 MG per tablet 2 tablet  . ondansetron (ZOFRAN) injection 4 mg  . sodium citrate-citric acid (ORACIT) solution 30 mL  . lidocaine (PF) (XYLOCAINE) 1 % injection 30 mL  . fentaNYL (SUBLIMAZE) injection 50-100 mcg  . betamethasone acetate-betamethasone sodium phosphate (CELESTONE) injection 12 mg    Assessment/Plan: Chelsea Ballard is a 34 y.o. female, G3P1011, IUP at 34.5 weeks, presenting for PPROM, clear around 2130 @ 7/26 with early labor and cxt Q3-65mins, but tolerates well. Pt of Dr Charlotta Newton with [facility]. No prenantal record on hand but per pt anatomy scan at 20 weeks grossly unremarkable. Genetic screening negative. Baby female no circ. Labs all negative per pt. GBS unknown. EFW 4.7lbs on 7/20, dx with GDMA1 diet controlled. One prior vaginal delivery at 37 weeks, pelvis proven to 5.11lbs.  Pt endorse + Fm. Denies vaginal bleeding.   FWB: Cat 1 Fetal Tracing.   Plan: Admit to [facility] per consult with Dr  Sallye Ober Routine [facility] orders Pain med/epidural prn GDMA1: Q4H BS UC pending.  NICU called and consult Pending. BMZ started at 0009 7/26. GBS unknown PCR pening now, plan to start penicillin if position. Anticipate labor progression   Report to be given in the morning for plan of care with [facility] physicians.   Dale [place] NP-C, CNM, MSN 09/27/2019, 12:39 AM

## 2019-09-28 LAB — CULTURE, BETA STREP (GROUP B ONLY)

## 2019-09-28 LAB — SURGICAL PATHOLOGY

## 2019-09-28 NOTE — Progress Notes (Signed)
Postpartum Note Day # 1  S:  Patient resting comfortable in bed.  Pain controlled.  Tolerating general diet. No flatus, no BM.  Lochia minimal.  Ambulating without difficulty.  She denies n/v/f/c, SOB, or CP.  Pt plans on breastfeeding.  O: Temp:  [97.8 F (36.6 C)-98.2 F (36.8 C)] 97.8 F (36.6 C) (07/27 0505) Pulse Rate:  [70-83] 82 (07/27 0505) Resp:  [16-18] 17 (07/27 0505) BP: (95-111)/(50-77) 100/70 (07/27 0505) SpO2:  [98 %-100 %] 98 % (07/27 0505) Gen: A&Ox3, NAD CV: RRR Resp: CTAB Abdomen: soft, NT, ND Uterus: firm, non-tender, below umbilicus Ext: No edema, no calf tenderness bilaterally, SCDs in place  Labs:  Recent Labs    09/27/19 0002 09/27/19 0738  HGB 13.7 13.7    A/P: Pt is a 34 y.o. G2P1102 s/p NSVD, PPD#1  - Pain well controlled -GU: voiding freely -GI: Tolerating general diet -Activity: encouraged sitting up to chair and ambulation as tolerated -Prophylaxis: early ambulation -Labs: stable as above  DISPO: Continue with routine postpartum care  Myna Hidalgo, DO [phone] (cell) [phone] (office)

## 2019-09-28 NOTE — Progress Notes (Signed)
Patient screened out for psychosocial assessment since none of the following apply:  Psychosocial stressors documented in mother or baby's chart  Gestation less than 32 weeks  Code at delivery   Infant with anomalies Please contact the Clinical Social Worker if specific needs arise, by MOB's request, or if MOB scores greater than 9/yes to question 10 on Edinburgh Postpartum Depression Screen.  Eldo Umanzor, LCSW Clinical Social Worker [facility] Cell#: [phone]     

## 2019-09-28 NOTE — Lactation Note (Signed)
This note was copied from a baby's chart. Lactation Consultation Note  Patient Name: Chelsea Ballard [id number] Date: 09/28/2019 Reason for consult: Follow-up assessment;NICU baby;Late-preterm 34-36.6wks Type of Endocrine Disorder?: Diabetes  1550 - 1603 - I followed up with [name]. She reports that she pumped one time earlier today. She is not seeing her breast milk when she pumps and she has concerns about milk production. [name] is now 5 hours old. I reviewed hand expression and noted a glistening of colostrum on the right side. I encouraged [name] to repeat back, and she was able to do so on her own.  [name] states that she was ready to pump. I helped her initiate pumping using her DEBP. Size 24 flanges appear appropriate. I reviewed pump settings and when to expect her mature milk to transition. I encouraged her to pump every 2-3 hours during the day and every 3-4 hours at night. She verbalized understanding.    Feeding Feeding Type: Donor Breast Milk  Interventions Interventions: Breast feeding basics reviewed;DEBP  Lactation Tools Discussed/Used Tools: Pump Breast pump type: Double-Electric Breast Pump WIC Program: Yes Pump Review: Setup, frequency, and cleaning   Consult Status Consult Status: Follow-up Date: 09/29/19 Follow-up type: In-patient    Walker Shadow 09/28/2019, 4:07 PM

## 2019-09-29 MED ORDER — IBUPROFEN 600 MG PO TABS: 600.0000 mg | ORAL_TABLET | Freq: Four times a day (QID) | ORAL | 0 refills | Status: DC

## 2019-09-29 MED ORDER — DOCUSATE SODIUM 100 MG PO CAPS: 100.0000 mg | ORAL_CAPSULE | Freq: Two times a day (BID) | ORAL | 0 refills | Status: AC | PRN

## 2019-09-29 MED ORDER — ACETAMINOPHEN 325 MG PO TABS: 650.0000 mg | ORAL_TABLET | Freq: Four times a day (QID) | ORAL | 0 refills | Status: AC | PRN

## 2019-09-29 MED ORDER — OXYCODONE HCL 5 MG PO TABS: 5.0000 mg | ORAL_TABLET | Freq: Four times a day (QID) | ORAL | 0 refills | Status: DC | PRN

## 2019-09-29 NOTE — Progress Notes (Signed)
CSW met with MOB and FOB in room 329 at the request of MOB. When CSW arrived MOB was resting in bed, FOB was on his phone on the couch, and infant was asleep in the isolette. CSW introduced herself and explained CSW's role. CSW made the couple aware that CSW was visiting family at the request of MOB.  MOB thanked CSW for coming by and stated, "I wanted to see what resources you have available for me and my family." MOB communicated that she had a friend that informed her that the NICU have resources available for all families. CSW assessed family to determine what resources were needed and was resources were available. MOB shared having a financial hardship and openly shared that she and FOB are currently unemployed. CSW provided the family with information to apply for [facility].  CSW also provided the family with meal vouchers to off set the cost for food while visiting with infant in the NICU; the family expressed gratitude.   CSW made MOB aware to contact CSW if any additional needs or concerns arise.   Laurey Arrow, MSW, LCSW Clinical Social Work [phone]

## 2019-09-29 NOTE — Progress Notes (Signed)
Discharge instructions reviewed with and given to pt with verbalization of understanding.

## 2019-09-29 NOTE — Discharge Summary (Signed)
Postpartum Discharge Summary     Patient Name: Chelsea Ballard DOB: 1985-03-25 MRN: [id number]  Date of admission: 09/26/2019 Delivery date:09/27/2019  Delivering provider: Noralyn Pick  Date of discharge: 09/29/2019  Admitting diagnosis: Preterm premature rupture of membranes (PPROM) delivered, current hospitalization [O42.919] Intrauterine pregnancy: [redacted]w[redacted]d    Secondary diagnosis:  Active Problems:   Preterm premature rupture of membranes (PPROM) delivered, current hospitalization  Additional problems: GDMA1    Discharge diagnosis: Preterm Pregnancy Delivered and GDM A1                                              Post partum procedures:none Augmentation: N/A Complications: None  Hospital course: Onset of Labor With Vaginal Delivery      34y.o. yo [id number] admitted in Latent Labor on 09/26/2019. Patient had an uncomplicated labor course as follows:  Membrane Rupture Time/Date: 9:25 PM ,09/26/2019   Delivery Method:Vaginal, Spontaneous  Episiotomy: None  Lacerations:  None  Patient had an uncomplicated postpartum course.  She is ambulating, tolerating a regular diet, passing flatus, and urinating well. Patient is discharged home in stable condition on 09/29/19.  Newborn Data: Birth date:09/27/2019  Birth time:2:56 AM  Gender:Female  Living status:Living  Apgars:8 ,9  Weight:1920 g   Magnesium Sulfate received: No BMZ received: Yes Rhophylac:No MMR:No T-DaP:Given prenatally Flu: No Transfusion:No  Physical exam  Vitals:   09/28/19 0505 09/28/19 1409 09/28/19 1915 09/29/19 0506  BP: 100/70 109/80 113/81 107/72  Pulse: 82 80 73 65  Resp: _0 Temp: 97.8 F (36.6 C) 97.9 F (36.6 C) 97.9 F (36.6 C) 97.9 F (36.6 C)  TempSrc: Oral Oral Oral Oral  SpO2: 98% 100% 100% 99%  Weight:      Height:       General: alert, cooperative and no distress Lochia: appropriate Uterine Fundus: firm Incision: N/A DVT Evaluation: No evidence of DVT seen on  physical exam. Labs: Lab Results  Component Value Date   WBC 13.3 (H) 09/27/2019   HGB 13.7 09/27/2019   HCT 41.4 09/27/2019   MCV 89.6 09/27/2019   PLT 205 09/27/2019   CMP Latest Ref Rng & Units 03/21/2019  Glucose 70 - 99 mg/dL 105(H)  BUN 6 - 20 mg/dL 6  Creatinine 0.44 - 1.00 mg/dL 0.51  Sodium 135 - 145 mmol/L 135  Potassium 3.5 - 5.1 mmol/L 4.3  Chloride 98 - 111 mmol/L 103  CO2 22 - 32 mmol/L 23  Calcium 8.9 - 10.3 mg/dL 9.2  Total Protein 6.5 - 8.1 g/dL 7.0  Total Bilirubin 0.3 - 1.2 mg/dL 0.3  Alkaline Phos 38 - 126 U/L 53  AST 15 - 41 U/L 18  ALT 0 - 44 U/L 17   Edinburgh Score: Edinburgh Postnatal Depression Scale Screening Tool 09/27/2019  I have been able to laugh and see the funny side of things. 0  I have looked forward with enjoyment to things. 0  I have blamed myself unnecessarily when things went wrong. 0  I have been anxious or worried for no good reason. 0  I have felt scared or panicky for no good reason. 0  Things have been getting on top of me. 0  I have been so unhappy that I have had difficulty sleeping. 2  I have felt sad or miserable. 1  I have  been so unhappy that I have been crying. 2  The thought of harming myself has occurred to me. 0  Edinburgh Postnatal Depression Scale Total 5      After visit meds:  Allergies as of 09/29/2019      Reactions   Peanut-containing Drug Products Rash      Medication List    STOP taking these medications   omeprazole 20 MG capsule Commonly known as: PRILOSEC   ondansetron 8 MG disintegrating tablet Commonly known as: Zofran ODT   polyvinyl alcohol 1.4 % ophthalmic solution Commonly known as: LIQUIFILM TEARS     TAKE these medications   acetaminophen 325 MG tablet Commonly known as: Tylenol Take 2 tablets (650 mg total) by mouth every 6 (six) hours as needed for mild pain or moderate pain (for pain scale < 4).   CitraNatal Rx 27-1 MG Tabs Take 1 tablet by mouth daily.   docusate sodium 100  MG capsule Commonly known as: Colace Take 1 capsule (100 mg total) by mouth 2 (two) times daily as needed for up to 20 days for mild constipation or moderate constipation.   ibuprofen 600 MG tablet Commonly known as: ADVIL Take 1 tablet (600 mg total) by mouth every 6 (six) hours. What changed:   when to take this  reasons to take this  additional instructions   oxyCODONE 5 MG immediate release tablet Commonly known as: Oxy IR/ROXICODONE Take 1 tablet (5 mg total) by mouth every 6 (six) hours as needed for severe pain or breakthrough pain (pain scale 4-7).        Discharge home in stable condition Infant Feeding: NICU Infant Disposition:NICU Discharge instruction: per After Visit Summary and Postpartum booklet. Activity: Advance as tolerated. Pelvic rest for 6 weeks.  Diet: carb modified diet Anticipated Birth Control: POPs Postpartum Appointment:3 weeks Additional Postpartum F/U: Postpartum Depression checkup Future Appointments:No future appointments. Follow up Visit:  Follow-up Information    Janyth Pupa, DO Follow up in 3 week(s).   Specialty: Obstetrics and Gynecology Contact information: [address] [place] [address] [phone]                   09/29/2019 Chelsea Genta, DO

## 2019-09-29 NOTE — Discharge Instructions (Signed)
Postpartum Care After Vaginal Delivery This sheet gives you information about how to care for yourself from the time you deliver your baby to up to 6-12 weeks after delivery (postpartum period). Your health care provider may also give you more specific instructions. If you have problems or questions, contact your health care provider. Follow these instructions at home: Vaginal bleeding  It is normal to have vaginal bleeding (lochia) after delivery. Wear a sanitary pad for vaginal bleeding and discharge. ? During the first week after delivery, the amount and appearance of lochia is often similar to a menstrual period. ? Over the next few weeks, it will gradually decrease to a dry, yellow-brown discharge. ? For most women, lochia stops completely by 4-6 weeks after delivery. Vaginal bleeding can vary from woman to woman.  Change your sanitary pads frequently. Watch for any changes in your flow, such as: ? A sudden increase in volume. ? A change in color. ? Large blood clots.  If you pass a blood clot from your vagina, save it and call your health care provider to discuss. Do not flush blood clots down the toilet before talking with your health care provider.  Do not use tampons or douches until your health care provider says this is safe.  If you are not breastfeeding, your period should return 6-8 weeks after delivery. If you are feeding your child breast milk only (exclusive breastfeeding), your period may not return until you stop breastfeeding. Perineal care  Keep the area between the vagina and the anus (perineum) clean and dry as told by your health care provider. Use medicated pads and pain-relieving sprays and creams as directed.  If you had a cut in the perineum (episiotomy) or a tear in the vagina, check the area for signs of infection until you are healed. Check for: ? More redness, swelling, or pain. ? Fluid or blood coming from the cut or tear. ? Warmth. ? Pus or a bad  smell.  You may be given a squirt bottle to use instead of wiping to clean the perineum area after you go to the bathroom. As you start healing, you may use the squirt bottle before wiping yourself. Make sure to wipe gently.  To relieve pain caused by an episiotomy, a tear in the vagina, or swollen veins in the anus (hemorrhoids), try taking a warm sitz bath 2-3 times a day. A sitz bath is a warm water bath that is taken while you are sitting down. The water should only come up to your hips and should cover your buttocks. Breast care  Within the first few days after delivery, your breasts may feel heavy, full, and uncomfortable (breast engorgement). Milk may also leak from your breasts. Your health care provider can suggest ways to help relieve the discomfort. Breast engorgement should go away within a few days.  If you are breastfeeding: ? Wear a bra that supports your breasts and fits you well. ? Keep your nipples clean and dry. Apply creams and ointments as told by your health care provider. ? You may need to use breast pads to absorb milk that leaks from your breasts. ? You may have uterine contractions every time you breastfeed for up to several weeks after delivery. Uterine contractions help your uterus return to its normal size. ? If you have any problems with breastfeeding, work with your health care provider or lactation consultant.  If you are not breastfeeding: ? Avoid touching your breasts a lot. Doing this can make   your breasts produce more milk. ? Wear a good-fitting bra and use cold packs to help with swelling. ? Do not squeeze out (express) milk. This causes you to make more milk. Intimacy and sexuality  Ask your health care provider when you can engage in sexual activity. This may depend on: ? Your risk of infection. ? How fast you are healing. ? Your comfort and desire to engage in sexual activity.  You are able to get pregnant after delivery, even if you have not had  your period. If desired, talk with your health care provider about methods of birth control (contraception). Medicines  Take over-the-counter and prescription medicines only as told by your health care provider.  For pain management:  You can take Tylenol or Ibuprofen every 6 hours as needed for pain  For severe pain you can take oxycodone 1 tablet every 6 hours.  This medication may cause constipation- be sure to take colace (stool softener) while taking this medication  If you were prescribed an antibiotic medicine, take it as told by your health care provider. Do not stop taking the antibiotic even if you start to feel better. Activity  Gradually return to your normal activities as told by your health care provider. Ask your health care provider what activities are safe for you.  Rest as much as possible. Try to rest or take a nap while your baby is sleeping. Eating and drinking   Drink enough fluid to keep your urine pale yellow.  Eat high-fiber foods every day. These may help prevent or relieve constipation. High-fiber foods include: ? Whole grain cereals and breads. ? Brown rice. ? Beans. ? Fresh fruits and vegetables.  Do not try to lose weight quickly by cutting back on calories.  Take your prenatal vitamins until your postpartum checkup or until your health care provider tells you it is okay to stop. Lifestyle  Do not use any products that contain nicotine or tobacco, such as cigarettes and e-cigarettes. If you need help quitting, ask your health care provider.  Do not drink alcohol, especially if you are breastfeeding. General instructions  Keep all follow-up visits for you and your baby as told by your health care provider. Most women visit their health care provider for a postpartum checkup within the first 3-6 weeks after delivery. Contact a health care provider if:  You feel unable to cope with the changes that your child brings to your life, and these feelings  do not go away.  You feel unusually sad or worried.  Your breasts become red, painful, or hard.  You have a fever.  You have trouble holding urine or keeping urine from leaking.  You have little or no interest in activities you used to enjoy.  You have not breastfed at all and you have not had a menstrual period for 12 weeks after delivery.  You have stopped breastfeeding and you have not had a menstrual period for 12 weeks after you stopped breastfeeding.  You have questions about caring for yourself or your baby.  You pass a blood clot from your vagina. Get help right away if:  You have chest pain.  You have difficulty breathing.  You have sudden, severe leg pain.  You have severe pain or cramping in your lower abdomen.  You bleed from your vagina so much that you fill more than one sanitary pad in one hour. Bleeding should not be heavier than your heaviest period.  You develop a severe headache.  You  faint.  You have blurred vision or spots in your vision.  You have bad-smelling vaginal discharge.  You have thoughts about hurting yourself or your baby. If you ever feel like you may hurt yourself or others, or have thoughts about taking your own life, get help right away. You can go to the nearest emergency department or call:  Your local emergency services (911 in the [place]  A suicide crisis helpline, such as the National Suicide Prevention Lifeline at (727)297-7240. This is open 24 hours a day. Summary  The period of time right after you deliver your newborn up to 6-12 weeks after delivery is called the postpartum period.  Gradually return to your normal activities as told by your health care provider.  Keep all follow-up visits for you and your baby as told by your health care provider. This information is not intended to replace advice given to you by your health care provider. Make sure you discuss any questions you have with your health care  provider. Document Revised: 02/21/2017 Document Reviewed: 12/02/2016 Elsevier Patient Education  2020 [employer].

## 2019-10-07 ENCOUNTER — Ambulatory Visit: Payer: Self-pay

## 2019-10-07 NOTE — Lactation Note (Signed)
This note was copied from a baby's chart. Lactation Consultation Note  Patient Name: Chelsea Ballard LMBEM'L Date: 10/07/2019   Baby had an appt with lactation at 2 pm; LC came to assist with feeding but NICU RN told LC that mom is not here yet. Next feeding will be at 5 pm. Asked NICU RN to page lactation if mom will be back for the next feeding or to reschedule at another time.   Maternal Data    Feeding Feeding Type: Breast Milk  LATCH Score                   Interventions    Lactation Tools Discussed/Used     Consult Status      Chelsea Ballard S Chelsea Ballard 10/07/2019, 2:04 PM

## 2019-10-15 ENCOUNTER — Ambulatory Visit: Payer: Self-pay

## 2019-10-15 NOTE — Lactation Note (Signed)
This note was copied from a baby's chart. Lactation Consultation Note  Patient Name: Chelsea Ballard GEZMO'Q Date: 10/15/2019   RN states that MOB wants to see lactation. I visited today, and FOB was in room with baby. He called her, and she requested a consult at 1000 on 8/14.   Walker Shadow 10/15/2019, 9:51 AM

## 2019-10-16 ENCOUNTER — Ambulatory Visit: Payer: Self-pay

## 2019-10-16 NOTE — Lactation Note (Signed)
This note was copied from a baby's chart. Lactation Consultation Note  Patient Name: Chelsea Ballard RDEYC'X Date: 10/16/2019 Reason for consult: Follow-up assessment;Difficult latch;1st time breastfeeding;NICU baby;Infant < 6lbs;Early term 37-38.6wks  LC assistance with positioning and latching baby for first time.  Baby is 17 weeks old and AGA [redacted]w[redacted]d and weighs 5 lbs 5 oz (increase of 35 gms from yesterday)  Baby taking feedings by gavage and bottle.  Mom would like assistance with breastfeeding.   Mom's breasts are small, and nipples flat.  Mom last pumped last evening (10 hrs ago).  Mom is pumping every 2-3 hrs during the day, and sleeping 10-12 hrs at night.  Reinforced importance of frequent consistent pumping.    Baby positioned in football hold on both breasts and then reclined Mom back for a more comfortable position.  Initiated a 20, changing to 16 mm nipple shield on left breast.  Baby able to latch, but not deeply on breast.  Baby on breast off and on for 20 mins but only sucked on and off with one swallow identified.  Some milk noted in nipple shield.  Mom's nipple not pulled into shield well.  Breasts are full from not pumping.  Lots of basic breastfeeding education reviewed on positioning and latching and importance of supporting breast and baby's head from ear to ear to support lower head, neck and upper back.  Mom seems very hesitant and afraid of hurting baby.  Reassured her that baby needs her hand support.  Assisted Mom to double pump after breastfeeding.  Gave Mom a "belly band" to use as a hand's free pumping bra.  Mom had been using initiation setting.  Instructed her to use standard maintenance setting and encouraged her to pump both breasts for 15-30 mins while doing hand's on massage of breasts.  Warm moist clothes placed on breasts.  Mom expressing more milk as she is more relaxed.  Mom asked for another band to take home to pump hand's free.  Reassured Mom and encouraged  more STS with baby.  Mom aware of Lactation support available to her.   Feeding Feeding Type: Breast Fed  LATCH Score Latch: Repeated attempts needed to sustain latch, nipple held in mouth throughout feeding, stimulation needed to elicit sucking reflex.  Audible Swallowing: None (1 swallow identified)  Type of Nipple: Flat  Comfort (Breast/Nipple): Soft / non-tender (Mom hadn't pumped in 10 hrs and breast full, nipples flat)  Hold (Positioning): Assistance needed to correctly position infant at breast and maintain latch.  LATCH Score: 5  Interventions Interventions: Breast feeding basics reviewed;Assisted with latch;Skin to skin;Hand express;Breast massage;Pre-pump if needed;Breast compression;Adjust position;Support pillows;Position options;Expressed milk;DEBP    Consult Status Consult Status: Follow-up Date: 10/18/19 Follow-up type: In-patient    [name] 10/16/2019, 12:04 PM

## 2019-10-18 ENCOUNTER — Ambulatory Visit: Payer: Self-pay

## 2019-10-18 NOTE — Lactation Note (Signed)
This note was copied from a baby's chart. Lactation Consultation Note  Patient Name: Chelsea Ballard Date: 10/18/2019 Reason for consult: Follow-up assessment;NICU baby   SLP requested LC to observe/assist with feeding.    Mom holding infant in cradle hold with infant dressed and partially swaddled in blanket.  LC unwrapped infant to place him STS.  Mom was using a NS size 16.  LC reviewed proper application of NS.  Curved tip syringe used to prefill shield.  LC provided more pillow support and suggested more head and neck support for infant and for moms breast when latching infant.  Mom used the cross cradle.   Mom was able to use breast support to help stabilize infant's target with shield on, then [facility] assisted with bringing infant to the breast when the gape was wide.  Infant latched and sucked a few times, then would come off just onto the tip of the NS.  LC provided more blankets for support and encouraged mom to keep infant so close when feeding that the cheeks of baby were touching mom's breast.  Mom relatched and infant began rhythmically sucking.  LC encouraged mom to use massage and compression in order to keep infant actively feeding.  Lips of infant were flanged and mouth was opened widely without the NS visible.  Mom expressed feeling a strong tug without pain.  Mom used her thumb support breast to massage while infant fed.  Infant has good rhythmic jaw movement and audible swallows heard. Active sucking observed throughout the feeding with a few pauses noted.  Infant's arms were relaxed at the end of the feeding and he was asleep.  NS was full of mom's milk and nipple was well everted into shield.    Mom was very excited about the successful feeding.  LC praised mom on efforts.    LC reminded mom to use good positioning and to bring infant to the breast when latching.   Mom desires another visit from lactation if possible tomorrow.  LC let RN know we could attend the 11 or 2  and will check back tomorrow to see when mom is there.  Mom has an OB check up tomorrow morning.       Maternal Data    Feeding Feeding Type: Breast Fed  LATCH Score Latch: Repeated attempts needed to sustain latch, nipple held in mouth throughout feeding, stimulation needed to elicit sucking reflex.  Audible Swallowing: A few with stimulation  Type of Nipple: Flat (nipple pulled into NS well after BF)  Comfort (Breast/Nipple): Soft / non-tender  Hold (Positioning): Assistance needed to correctly position infant at breast and maintain latch.  LATCH Score: 6  Interventions Interventions: Breast feeding basics reviewed;Skin to skin;Breast massage;Position options;Adjust position;Support pillows;Breast compression  Lactation Tools Discussed/Used Tools: Nipple Shields Nipple shield size: 16   Consult Status Consult Status: Follow-up Date: 10/19/19 Follow-up type: In-patient    [name] 10/18/2019, 4:33 PM

## 2019-10-20 ENCOUNTER — Ambulatory Visit: Payer: Self-pay

## 2019-10-20 NOTE — Lactation Note (Signed)
This note was copied from a baby's chart. Lactation Consultation Note  Patient Name: Boy Carson Meche IRCVE'L Date: 10/20/2019 Reason for consult: Follow-up assessment   Baby 5 lbs. 8.7 oz today.  LC in to assist with positioning and latching baby to the breast.  Baby showing feeding cues.  Mom states she last pumped 4 hrs before.  Breasts are full but compressible.    Assisted Mom to use cross cradle hold.  Mom needing much assistance and reminders to not place her hands on areola.  Initiated a 16 mm nipple shield, Mom successfully place shield pulling her nipple into shield after hand expressing a drop onto nipple.   Baby latched after a few attempts.  Repeatedly assisted Mom to support/sandwich breast at chest wall.  Baby able to sustain the latch but nipple was moving in and out of his mouth.  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  Baby fed on right breast for 20 mins, burped and then started cueing again.  Mom needing little assistance with latching on left breast, except reminder to support her breast at base of breast.  Baby stayed on another 20 mins with nutritive sucking and swallowing.  Milk filled the nipple shield at end of feeding. Baby contented.  Encouraged Mom to pump both breasts after feeding to support a full milk supply.  Mom continues to not pump at night, but the hand's free bra has been a lot of help so she can massage her breasts during pumping.  Both breasts are softened and feel lighter after breastfeeding.   Feeding Feeding Type: Breast Fed Nipple Type: Dr. Levert Feinstein Preemie  LATCH Score Latch: Repeated attempts needed to sustain latch, nipple held in mouth throughout feeding, stimulation needed to elicit sucking reflex.  Audible Swallowing: Spontaneous and intermittent  Type of Nipple: Everted at rest and after  stimulation  Comfort (Breast/Nipple): Soft / non-tender  Hold (Positioning): Assistance needed to correctly position infant at breast and maintain latch.  LATCH Score: 8  Interventions Interventions: Breast feeding basics reviewed;Assisted with latch;Skin to skin;Hand express;Breast massage;Breast compression;Adjust position;Support pillows;Position options;Expressed milk;DEBP  Lactation Tools Discussed/Used Tools: Pump;Nipple Shields Nipple shield size: 16 Breast pump type: Double-Electric Breast Pump   Consult Status Consult Status: Follow-up Date: 10/21/19 Follow-up type: In-patient    [name] 10/20/2019, 3:04 PM

## 2019-12-17 DIAGNOSIS — E162 Hypoglycemia, unspecified: Secondary | ICD-10-CM | POA: Insufficient documentation

## 2020-12-15 DIAGNOSIS — R5383 Other fatigue: Secondary | ICD-10-CM | POA: Insufficient documentation

## 2021-01-06 ENCOUNTER — Encounter: Payer: Medicaid Other | Admitting: Nurse Practitioner

## 2021-01-06 NOTE — Progress Notes (Signed)
Patient scheduled appointment for her child under her name- appointment cancelled and message sent to mother

## 2022-01-12 ENCOUNTER — Other Ambulatory Visit: Payer: Self-pay

## 2022-01-12 ENCOUNTER — Ambulatory Visit
Admission: EM | Admit: 2022-01-12 | Discharge: 2022-01-12 | Disposition: A | Discharge status: Home / Self Care | Payer: Medicaid Other | Attending: Urgent Care | Admitting: Urgent Care

## 2022-01-12 DIAGNOSIS — J988 Other specified respiratory disorders: Secondary | ICD-10-CM | POA: Insufficient documentation

## 2022-01-12 DIAGNOSIS — R051 Acute cough: Secondary | ICD-10-CM | POA: Insufficient documentation

## 2022-01-12 DIAGNOSIS — F17200 Nicotine dependence, unspecified, uncomplicated: Secondary | ICD-10-CM | POA: Insufficient documentation

## 2022-01-12 DIAGNOSIS — Z1152 Encounter for screening for COVID-19: Secondary | ICD-10-CM | POA: Insufficient documentation

## 2022-01-12 DIAGNOSIS — B9789 Other viral agents as the cause of diseases classified elsewhere: Secondary | ICD-10-CM | POA: Insufficient documentation

## 2022-01-12 LAB — RESP PANEL BY RT-PCR (RSV, FLU A&B, COVID)  RVPGX2
Influenza A by PCR: NEGATIVE
Influenza B by PCR: NEGATIVE
Resp Syncytial Virus by PCR: NEGATIVE
SARS Coronavirus 2 by RT PCR: NEGATIVE

## 2022-01-12 MED ORDER — PSEUDOEPHEDRINE HCL 60 MG PO TABS: 60.0000 mg | ORAL_TABLET | Freq: Three times a day (TID) | ORAL | 0 refills | Status: DC | PRN

## 2022-01-12 MED ORDER — BENZONATATE 100 MG PO CAPS: 100.0000 mg | ORAL_CAPSULE | Freq: Three times a day (TID) | ORAL | 0 refills | Status: DC | PRN

## 2022-01-12 MED ORDER — CETIRIZINE HCL 10 MG PO TABS: 10.0000 mg | ORAL_TABLET | Freq: Every day | ORAL | 0 refills | Status: DC

## 2022-01-12 MED ORDER — PROMETHAZINE-DM 6.25-15 MG/5ML PO SYRP: 5.0000 mL | ORAL_SOLUTION | Freq: Three times a day (TID) | ORAL | 0 refills | Status: DC | PRN

## 2022-01-12 NOTE — ED Triage Notes (Signed)
Patient presents to UC for chills, cough, and sore throat since 3 days ago. Treating with dayquil, nyquil, and tylenol. Last known fever yesterday.

## 2022-01-12 NOTE — ED Provider Notes (Signed)
[facility]  Note:  This document was prepared using Conservation officer, historic buildings and may include unintentional dictation errors.  MRN: [id number] DOB: Oct 13, 1985  Subjective:   Chelsea Ballard is a 36 y.o. female presenting for 3-day history of acute onset persistent chills, coughing, throat pain, sinus headaches.  Patient has had 1 sick contact with her son who is being seen here today as well.  Has been using over-the-counter medications with minimal relief.  No chest pain, shortness of breath or wheezing.  No history of allergies, respiratory disorders.  Patient smoker, no vaping no drug use.  She would like to get testing done including RSV has her son is young and wants to make sure that she gets the testing done instead of him.  No current facility-administered medications for this encounter.  Current Outpatient Medications:    acetaminophen (TYLENOL) 325 MG tablet, Take 2 tablets (650 mg total) by mouth every 6 (six) hours as needed for mild pain or moderate pain (for pain scale < 4)., Disp: 30 tablet, Rfl: 0   ibuprofen (ADVIL) 600 MG tablet, Take 1 tablet (600 mg total) by mouth every 6 (six) hours., Disp: 30 tablet, Rfl: 0   omeprazole (PRILOSEC) 20 MG capsule, Take 20 mg by mouth 2 (two) times daily., Disp: , Rfl:    oxyCODONE (OXY IR/ROXICODONE) 5 MG immediate release tablet, Take 1 tablet (5 mg total) by mouth every 6 (six) hours as needed for severe pain or breakthrough pain (pain scale 4-7)., Disp: 6 tablet, Rfl: 0   Prenat w/o A-FeCb-FeGl-DSS-FA (CITRANATAL RX) 27-1 MG TABS, Take 1 tablet by mouth daily., Disp: , Rfl:    Allergies  Allergen Reactions   Peanut-Containing Drug Products Rash    Past Medical History:  Diagnosis Date   Gestational diabetes    Migraine      Past Surgical History:  Procedure Laterality Date   TONSILLECTOMY     TOOTH EXTRACTION  2017    Family History  Problem Relation Age of Onset   Diabetes Paternal  Grandmother    Diabetes Paternal Grandfather     Social History   Tobacco Use   Smoking status: Never   Smokeless tobacco: Never  Vaping Use   Vaping Use: Never used  Substance Use Topics   Alcohol use: No   Drug use: No    ROS   Objective:   Vitals: BP 115/76 (BP Location: Left Arm)   Pulse 88   Temp 98.1 F (36.7 C) (Temporal)   Resp 16   LMP 12/21/2021 (Approximate)   SpO2 96%   Breastfeeding No   Physical Exam Constitutional:      General: She is not in acute distress.    Appearance: Normal appearance. She is well-developed and normal weight. She is not ill-appearing, toxic-appearing or diaphoretic.  HENT:     Head: Normocephalic and atraumatic.     Right Ear: Tympanic membrane, ear canal and external ear normal. No drainage or tenderness. No middle ear effusion. There is no impacted cerumen. Tympanic membrane is not erythematous or bulging.     Left Ear: Tympanic membrane, ear canal and external ear normal. No drainage or tenderness.  No middle ear effusion. There is no impacted cerumen. Tympanic membrane is not erythematous or bulging.     Nose: Nose normal. No congestion or rhinorrhea.     Mouth/Throat:     Mouth: Mucous membranes are moist. No oral lesions.     Pharynx: No pharyngeal  swelling, oropharyngeal exudate, posterior oropharyngeal erythema or uvula swelling.     Tonsils: No tonsillar exudate or tonsillar abscesses.  Eyes:     General: No scleral icterus.       Right eye: No discharge.        Left eye: No discharge.     Extraocular Movements: Extraocular movements intact.     Right eye: Normal extraocular motion.     Left eye: Normal extraocular motion.     Conjunctiva/sclera: Conjunctivae normal.  Cardiovascular:     Rate and Rhythm: Normal rate and regular rhythm.     Heart sounds: Normal heart sounds. No murmur heard.    No friction rub. No gallop.  Pulmonary:     Effort: Pulmonary effort is normal. No respiratory distress.     Breath  sounds: No stridor. No wheezing, rhonchi or rales.  Chest:     Chest wall: No tenderness.  Musculoskeletal:     Cervical back: Normal range of motion and neck supple.  Lymphadenopathy:     Cervical: No cervical adenopathy.  Skin:    General: Skin is warm and dry.  Neurological:     General: No focal deficit present.     Mental Status: She is alert and oriented to person, place, and time.  Psychiatric:        Mood and Affect: Mood normal.        Behavior: Behavior normal.     Assessment and Plan :   PDMP not reviewed this encounter.  1. Viral respiratory illness   2. Acute cough     Deferred imaging given clear cardiopulmonary exam, hemodynamically stable vital signs. Does not meet [facility] criteria for strep testing.  Respiratory panel pending. Will manage for viral illness such as viral URI, viral syndrome, viral rhinitis, COVID-19, influenza, RSV. Recommended supportive care. Offered scripts for symptomatic relief. Testing is pending. Counseled patient on potential for adverse effects with medications prescribed/recommended today, ER and return-to-clinic precautions discussed, patient verbalized understanding.     Wallis Bamberg, [place] 01/13/22 [phone]

## 2022-10-05 ENCOUNTER — Ambulatory Visit
Admission: EM | Admit: 2022-10-05 | Discharge: 2022-10-05 | Disposition: A | Discharge status: Home / Self Care | Payer: Medicaid Other | Attending: Emergency Medicine | Admitting: Emergency Medicine

## 2022-10-05 DIAGNOSIS — J069 Acute upper respiratory infection, unspecified: Secondary | ICD-10-CM | POA: Insufficient documentation

## 2022-10-05 DIAGNOSIS — Z3201 Encounter for pregnancy test, result positive: Secondary | ICD-10-CM | POA: Diagnosis present

## 2022-10-05 DIAGNOSIS — R112 Nausea with vomiting, unspecified: Secondary | ICD-10-CM | POA: Insufficient documentation

## 2022-10-05 DIAGNOSIS — Z1152 Encounter for screening for COVID-19: Secondary | ICD-10-CM | POA: Diagnosis present

## 2022-10-05 LAB — POCT URINE PREGNANCY: Preg Test, Ur: POSITIVE — AB

## 2022-10-05 MED ORDER — DOXYLAMINE-PYRIDOXINE 10-10 MG PO TBEC: 1.0000 | DELAYED_RELEASE_TABLET | Freq: Two times a day (BID) | ORAL | 0 refills | Status: AC

## 2022-10-05 MED ORDER — GUAIFENESIN 200 MG PO TABS: 200.0000 mg | ORAL_TABLET | ORAL | 0 refills | Status: DC | PRN

## 2022-10-05 NOTE — ED Provider Notes (Signed)
[facility]   [id number] 10/05/22 Arrival Time: 1126   Chief Complaint  Patient presents with   Fever   Emesis     SUBJECTIVE: History from: patient and family.  Chelsea Ballard is a 37 y.o. female who presents to the urgent care for complaint of fever, body aches and nausea and vomiting that started last night.  She reported she is pregnant [redacted] weeks ago and has an appointment scheduled with OB/GYN.  Denies sick exposure to COVID, flu or strep.  Denies recent travel.  Has not tried any OTC medication..  Denies any aggravating factors.  Denies previous symptoms in the past.   Denies  fatigue, sinus pain, rhinorrhea, sore throat, SOB, wheezing, chest pain, nausea, changes in bowel or bladder habits.     ROS: As per HPI.  All other pertinent ROS negative.      Past Medical History:  Diagnosis Date   Gestational diabetes    Migraine    Past Surgical History:  Procedure Laterality Date   TONSILLECTOMY     TOOTH EXTRACTION  2017   Allergies  Allergen Reactions   Peanut-Containing Drug Products Rash   No current facility-administered medications on file prior to encounter.   Current Outpatient Medications on File Prior to Encounter  Medication Sig Dispense Refill   acetaminophen (TYLENOL) 325 MG tablet Take 2 tablets (650 mg total) by mouth every 6 (six) hours as needed for mild pain or moderate pain (for pain scale < 4). 30 tablet 0   benzonatate (TESSALON) 100 MG capsule Take 1 capsule (100 mg total) by mouth 3 (three) times daily as needed for cough. 30 capsule 0   cetirizine (ZYRTEC ALLERGY) 10 MG tablet Take 1 tablet (10 mg total) by mouth daily. 30 tablet 0   ibuprofen (ADVIL) 600 MG tablet Take 1 tablet (600 mg total) by mouth every 6 (six) hours. 30 tablet 0   omeprazole (PRILOSEC) 20 MG capsule Take 20 mg by mouth 2 (two) times daily.     oxyCODONE (OXY IR/ROXICODONE) 5 MG immediate release tablet Take 1 tablet (5 mg total) by mouth every 6 (six) hours as needed  for severe pain or breakthrough pain (pain scale 4-7). 6 tablet 0   Prenat w/o A-FeCb-FeGl-DSS-FA (CITRANATAL RX) 27-1 MG TABS Take 1 tablet by mouth daily.     promethazine-dextromethorphan (PROMETHAZINE-DM) 6.25-15 MG/5ML syrup Take 5 mLs by mouth 3 (three) times daily as needed for cough. 100 mL 0   pseudoephedrine (SUDAFED) 60 MG tablet Take 1 tablet (60 mg total) by mouth every 8 (eight) hours as needed for congestion. 30 tablet 0   Social History   Socioeconomic History   Marital status: Single    Spouse name: Dickie La   Number of children: Not on file   Years of education: Not on file   Highest education level: Not on file  Occupational History   Occupation: sitter  Tobacco Use   Smoking status: Never   Smokeless tobacco: Never  Vaping Use   Vaping status: Never Used  Substance and Sexual Activity   Alcohol use: No   Drug use: No   Sexual activity: Yes    Birth control/protection: None  Other Topics Concern   Not on file  Social History Narrative   Not on file   Social Determinants of Health   Financial Resource Strain: Not on file  Food Insecurity: Not on file  Transportation Needs: Not on file  Physical Activity: Not on file  Stress:  Not on file  Social Connections: Not on file  Intimate Partner Violence: Not on file   Family History  Problem Relation Age of Onset   Diabetes Paternal Grandmother    Diabetes Paternal Grandfather     OBJECTIVE:  Vitals:   10/05/22 1247  BP: 103/69  Pulse: 97  Resp: 18  Temp: 98.2 F (36.8 C)  TempSrc: Oral  SpO2: 97%     General appearance: alert; appears fatigued, but nontoxic; speaking in full sentences and tolerating own secretions HEENT: NCAT; Ears: EACs clear, TMs pearly gray; Eyes: PERRL.  EOM grossly intact. Sinuses: nontender; Nose: nares patent without rhinorrhea, Throat: oropharynx clear, tonsils non erythematous or enlarged, uvula midline  Neck: supple without LAD Lungs: unlabored respirations,  symmetrical air entry; cough: mild; no respiratory distress; CTAB Heart: regular rate and rhythm.  Radial pulses 2+ symmetrical bilaterally Skin: warm and dry Psychological: alert and cooperative; normal mood and affect  LABS:  Results for orders placed or performed during the hospital encounter of 10/05/22 (from the past 24 hour(s))  POCT urine pregnancy     Status: Abnormal   Collection Time: 10/05/22  1:09 PM  Result Value Ref Range   Preg Test, Ur Positive (A) Negative     ASSESSMENT & PLAN:  1. Nausea and vomiting, unspecified vomiting type   2. Encounter for screening for COVID-19   3. URI with cough and congestion   4. Positive pregnancy test     Meds ordered this encounter  Medications   guaiFENesin 200 MG tablet    Sig: Take 1 tablet (200 mg total) by mouth every 4 (four) hours as needed for cough or to loosen phlegm.    Dispense:  30 tablet    Refill:  0   Doxylamine-Pyridoxine (DICLEGIS) 10-10 MG TBEC    Sig: Take 1 tablet by mouth in the morning and at bedtime.    Dispense:  60 tablet    Refill:  0    Discharge instructions  COVID test result will return in 24 to 48 hours Rest and push fluids Diclegis was prescribed for N/V Use medications daily for symptom relief Use OTC medications like ibuprofen or tylenol as needed fever or pain Call or go to the ED if you have any new or worsening symptoms such as fever, worsening cough, shortness of breath, chest tightness, chest pain, turning blue, changes in mental status, etc...   Reviewed expectations re: course of current medical issues. Questions answered. Outlined signs and symptoms indicating need for more acute intervention. Patient verbalized understanding. After Visit Summary given.          Durward Parcel, FNP 10/05/22 1324

## 2022-10-05 NOTE — ED Triage Notes (Signed)
Pt reports fever, body aches and vomiting since last night. Pt states she had a positive pregnancy test at home 2 weeks ago. Pt has an appt with OB/GYN 10/07/22.

## 2022-10-05 NOTE — Discharge Instructions (Signed)
COVID test result will return in 24 to 48 hours Rest and push fluids Diclegis was prescribed for N/V Use medications daily for symptom relief Use OTC medications like ibuprofen or tylenol as needed fever or pain Call or go to the ED if you have any new or worsening symptoms such as fever, worsening cough, shortness of breath, chest tightness, chest pain, turning blue, changes in mental status, etc..Marland Kitchen

## 2022-10-06 LAB — SARS CORONAVIRUS 2 (TAT 6-24 HRS): SARS Coronavirus 2: POSITIVE — AB

## 2022-10-17 ENCOUNTER — Other Ambulatory Visit (HOSPITAL_COMMUNITY)
Admission: RE | Admit: 2022-10-17 | Discharge: 2022-10-17 | Disposition: A | Discharge status: Home / Self Care | Payer: Medicaid Other | Source: Ambulatory Visit | Attending: Obstetrics and Gynecology | Admitting: Obstetrics and Gynecology

## 2022-10-17 ENCOUNTER — Other Ambulatory Visit: Payer: Self-pay | Admitting: Obstetrics and Gynecology

## 2022-10-17 DIAGNOSIS — Z3490 Encounter for supervision of normal pregnancy, unspecified, unspecified trimester: Secondary | ICD-10-CM | POA: Diagnosis not present

## 2022-10-21 LAB — CYTOLOGY - PAP
Comment: NEGATIVE
Diagnosis: NEGATIVE
High risk HPV: NEGATIVE

## 2023-03-05 NOTE — L&D Delivery Note (Signed)
Delivery Note At 2:23 PM a viable child was delivered via Vaginal, Spontaneous (Presentation: occiput anterior).  Tight nuchal cord could not be reduced. Cord was clamped x 2 and cut. The anterior shoulder and body were delivered without difficulty. APGAR: 7 at one minute and 9 at 5 minutes  weight  pending.   Placenta status:  intact with 3 vessel  Cord:   with the following complications: None .  Cord pH: NA  Anesthesia: Epidural Episiotomy: None Lacerations:  None Suture Repair:  NA Est. Blood Loss (mL):  75 cc  Mom to postpartum.  Baby to Couplet care / Skin to Skin.  Chelsea Ballard 04/23/2023, 2:33 PM

## 2023-03-10 NOTE — Therapy (Signed)
 OUTPATIENT PHYSICAL THERAPY LOWER EXTREMITY EVALUATION   Patient Name: SAIDE LANUZA MRN: [id number] DOB:1985/11/10, 38 y.o., female Today's Date: 03/11/2023  END OF SESSION:  PT End of Session - 03/11/23 1432     Visit Number 1    Date for PT Re-Evaluation 05/13/23    PT Start Time 1325    PT Stop Time 1430    PT Time Calculation (min) 65 min    Equipment Utilized During Treatment Back brace    Activity Tolerance Patient limited by pain    Behavior During Therapy [facility] for tasks assessed/performed             Past Medical History:  Diagnosis Date   Gestational diabetes    Migraine    Past Surgical History:  Procedure Laterality Date   TONSILLECTOMY     TOOTH EXTRACTION  2017   Patient Active Problem List   Diagnosis Date Noted   Fatigue 12/15/2020   Hypoglycemia 12/17/2019   Preterm premature rupture of membranes (PPROM) delivered, current hospitalization 09/27/2019   Abnormal glucose tolerance test (GTT) during pregnancy, antepartum 09/01/2019   Hemorrhoids 05/15/2010   Constipation 05/15/2010   Helicobacter pylori infection 03/26/2010   ACID REFLUX DISEASE 03/23/2010   FOLLICULITIS 03/23/2010   ACUTE PHARYNGITIS 04/06/2007   CONTACT DERMATITIS 04/06/2007    PCP: Cleotilde Planas, MD   REFERRING PROVIDER: Rosalva Rexene, MD   REFERRING DIAG:  S33.4XXA (ICD-10-CM) - Traumatic rupture of symphysis pubis, initial encounter  M54.30 (ICD-10-CM) - Sciatica, unspecified side    THERAPY DIAG:  Muscle weakness (generalized)  Other lack of coordination  Difficulty in walking, not elsewhere classified  Dislocation of symphysis pubis, initial encounter  Sciatica of right side  Rationale for Evaluation and Treatment: Rehabilitation  ONSET DATE: 02/12/23  SUBJECTIVE:   SUBJECTIVE STATEMENT: Patient reports she is very tired. She is wearing her sacral band.   PERTINENT HISTORY: Per referring provider note: Patient is [redacted] weeks pregnant. Sustained rupture of  symphysis pubis and sciatic pain. PAIN:  Are you having pain? Yes: NPRS scale: 7+ Pain location: B LE, R leg feels like it will give way. She did have prior back pain. Pain description: pressure Aggravating factors: Stair climbing Relieving factors: Hot pack on low back helps a little.  PRECAUTIONS: Fall and Other: [redacted] weeks pregnant.  RED FLAGS: None   WEIGHT BEARING RESTRICTIONS: No  FALLS:  Has patient fallen in last 6 months? No  LIVING ENVIRONMENT: Lives with: lives with their family Lives in: House/apartment Stairs: Yes: Internal: 14 steps; on right going up and External: 4 steps; can reach both Has following equipment at home: None  OCCUPATION: CNA at [employer].  PLOF: Independent  PATIENT GOALS: Patient would like help with her pain and make her delivery easier.  NEXT MD VISIT: weekly  OBJECTIVE:  Note: Objective measures were completed at Evaluation unless otherwise noted.  DIAGNOSTIC FINDINGS: N/A  PATIENT SURVEYS:  Modified Oswestry 32%   COGNITION: Overall cognitive status: Within functional limits for tasks assessed     SENSATION: Patient reports numbness on lateral R leg to calf.  EDEMA:  Patient denies edema  MUSCLE LENGTH: Deferred due to inability to achieve position  POSTURE: rounded shoulders, increased lumbar lordosis, and anterior pelvic tilt Seated, maintains B hip abduction due to discomfort from her extended abdomen/baby. She reports feeling pressure in her perineal area as well as low back, buttocks.  PALPATION: TTP along B SI joints and gluts, piriformis, R > L  LOWER EXTREMITY ROM: Hips  deferred due to her pregnancy and pain, knees and ankles appear WNL  LOWER EXTREMITY MMT: Hips deferred, knees and ankles 5/5   LOWER EXTREMITY SPECIAL TESTS:  deferred  GAIT: Distance walked: In clinic Assistive device utilized: None Level of assistance: Complete Independence Comments: Increased lateral sway, slow, laborious.                                                                                                                                 TREATMENT DATE:  03/11/23 Education    PATIENT EDUCATION:  Education details: POC, ways to avoid aggravating her SPD including hip abd, SLS, how to climb stairs sideways. Initiated deep breathing with kegels so she can familiarize herself with the technique. Person educated: Patient Education method: Explanation Education comprehension: verbalized understanding  HOME EXERCISE PROGRAM: TBD  ASSESSMENT:  CLINICAL IMPRESSION: Patient is a 38 y.o. who was seen today for physical therapy evaluation and treatment for ruptured pubic symphysis and R sciatic nerve pain. She is currently approx [redacted] weeks pregnant. She arrived about 10 minutes late today. Limited evaluation due to her level of pain and her body habitus at [redacted] weeks pregnant. She reports pain and pressure in perineal area as well as low back. She also reports numbness in her R lateral upper leg down into her R calf, with muscle spasms. Spasms improved with magnesium, but still present. She is TTP along B SI joints, gluts, and piriformis muscles, R > L, with spasm noted. Her muscle tone is decreased due to pregnancy hormones. She does wear a sacral belt, which helps some, sleeps on R side with a pillow between knees, but her sleep is limited to about 4 hours per night. Initiated education on how to minimize her discomfort and strain. She will benefit from further PT in order to address her acute pain and muscle spasm, and to establish HEP for strengthening her pelvic floor musculature to minimize her pain and discomfort as her pregnancy progresses and thereafter. Her first 2 children were born at 44 and 37 weeks, so we need to get her going quickly to maximize the benefit she will achieve prior to her having the baby. She will be able to use the techniques after having the baby to facilitate her recovery as well.  OBJECTIVE IMPAIRMENTS:  Abnormal gait, decreased activity tolerance, decreased balance, decreased coordination, decreased endurance, difficulty walking, decreased ROM, decreased strength, dizziness, impaired flexibility, and pain.   ACTIVITY LIMITATIONS: carrying, lifting, bending, standing, squatting, sleeping, stairs, transfers, and locomotion level  PARTICIPATION LIMITATIONS: meal prep, cleaning, laundry, driving, shopping, community activity, and occupation  PERSONAL FACTORS: Past/current experiences are also affecting patient's functional outcome.   REHAB POTENTIAL: Good  CLINICAL DECISION MAKING: Evolving/moderate complexity  EVALUATION COMPLEXITY: Moderate   GOALS: Goals reviewed with patient? Yes  SHORT TERM GOALS: Target date: 03/27/22 I with initial HEP Baseline: Goal status: INITIAL  LONG TERM GOALS: Target date: 05/13/23  I with final  HEP Baseline:  Goal status: INITIAL  2.  Patient will score < 20 on Owestry Baseline: 32% Goal status: INITIAL  3.  Patient will report 50% improvement in her R LE numbness Baseline:  Goal status: INITIAL  4.  Patient will report 50% improvement in her low back , B buttock pain Baseline: 7/10 Goal status: INITIAL  PLAN:  PT FREQUENCY: 2x/week  PT DURATION: other: 9 weeks  PLANNED INTERVENTIONS: 97110-Therapeutic exercises, 97530- Therapeutic activity, [id number]- Neuromuscular re-education, 97535- Self Care, [id number]- Manual therapy, [phone]- Gait training, Patient/Family education, Balance training, Stair training, Taping, Cryotherapy, and Moist heat  PLAN FOR NEXT SESSION: HEP for PSD   Devere CHRISTELLA Mean, DPT 03/11/2023, 2:43 PM

## 2023-03-11 ENCOUNTER — Encounter: Payer: Self-pay | Admitting: Physical Therapy

## 2023-03-11 ENCOUNTER — Ambulatory Visit: Payer: Medicaid Other | Attending: Obstetrics and Gynecology | Admitting: Physical Therapy

## 2023-03-11 DIAGNOSIS — M5431 Sciatica, right side: Secondary | ICD-10-CM | POA: Insufficient documentation

## 2023-03-11 DIAGNOSIS — M6281 Muscle weakness (generalized): Secondary | ICD-10-CM | POA: Diagnosis present

## 2023-03-11 DIAGNOSIS — S334XXA Traumatic rupture of symphysis pubis, initial encounter: Secondary | ICD-10-CM | POA: Insufficient documentation

## 2023-03-11 DIAGNOSIS — O26893 Other specified pregnancy related conditions, third trimester: Secondary | ICD-10-CM | POA: Diagnosis not present

## 2023-03-11 DIAGNOSIS — M545 Low back pain, unspecified: Secondary | ICD-10-CM | POA: Diagnosis not present

## 2023-03-11 DIAGNOSIS — R278 Other lack of coordination: Secondary | ICD-10-CM | POA: Insufficient documentation

## 2023-03-11 DIAGNOSIS — R262 Difficulty in walking, not elsewhere classified: Secondary | ICD-10-CM | POA: Insufficient documentation

## 2023-03-13 ENCOUNTER — Ambulatory Visit: Payer: Medicaid Other | Admitting: Physical Therapy

## 2023-03-13 ENCOUNTER — Encounter: Payer: Self-pay | Admitting: Physical Therapy

## 2023-03-13 DIAGNOSIS — M5431 Sciatica, right side: Secondary | ICD-10-CM

## 2023-03-13 DIAGNOSIS — R262 Difficulty in walking, not elsewhere classified: Secondary | ICD-10-CM

## 2023-03-13 DIAGNOSIS — M6281 Muscle weakness (generalized): Secondary | ICD-10-CM

## 2023-03-13 DIAGNOSIS — R278 Other lack of coordination: Secondary | ICD-10-CM

## 2023-03-13 DIAGNOSIS — S334XXA Traumatic rupture of symphysis pubis, initial encounter: Secondary | ICD-10-CM

## 2023-03-13 NOTE — Therapy (Signed)
 OUTPATIENT PHYSICAL THERAPY LOWER EXTREMITY EVALUATION   Patient Name: Chelsea Ballard MRN: [id number] DOB:[birth year], 38 y.o., female Today's Date: 03/13/2023  END OF SESSION:  PT End of Session - 03/13/23 1627     Visit Number 2    Date for PT Re-Evaluation 05/13/23    PT Start Time 1400    PT Stop Time 1440    PT Time Calculation (min) 40 min    Equipment Utilized During Treatment Back brace    Activity Tolerance Patient limited by pain    Behavior During Therapy [facility] for tasks assessed/performed              Past Medical History:  Diagnosis Date   Gestational diabetes    Migraine    Past Surgical History:  Procedure Laterality Date   TONSILLECTOMY     TOOTH EXTRACTION  2017   Patient Active Problem List   Diagnosis Date Noted   Fatigue 12/15/2020   Hypoglycemia 12/17/2019   Preterm premature rupture of membranes (PPROM) delivered, current hospitalization 09/27/2019   Abnormal glucose tolerance test (GTT) during pregnancy, antepartum 09/01/2019   Hemorrhoids 05/15/2010   Constipation 05/15/2010   Helicobacter pylori infection 03/26/2010   ACID REFLUX DISEASE 03/23/2010   FOLLICULITIS 03/23/2010   ACUTE PHARYNGITIS 04/06/2007   CONTACT DERMATITIS 04/06/2007    PCP: Cleotilde Planas, MD   REFERRING PROVIDER: Rosalva Rexene, MD   REFERRING DIAG:  S33.4XXA (ICD-10-CM) - Traumatic rupture of symphysis pubis, initial encounter  M54.30 (ICD-10-CM) - Sciatica, unspecified side    THERAPY DIAG:  Muscle weakness (generalized)  Other lack of coordination  Difficulty in walking, not elsewhere classified  Dislocation of symphysis pubis, initial encounter  Sciatica of right side  Rationale for Evaluation and Treatment: Rehabilitation  ONSET DATE: 02/12/23  SUBJECTIVE:   SUBJECTIVE STATEMENT: Patient reports she is very tired. She is wearing her sacral band. She worked x 4 hours yesterday and got through it, but it was challenging.  PERTINENT HISTORY: Per  referring provider note: Patient is [redacted] weeks pregnant. Sustained rupture of symphysis pubis and sciatic pain. PAIN:  Are you having pain? Yes: NPRS scale: 7+ Pain location: B LE, R leg feels like it will give way. She did have prior back pain. Pain description: pressure Aggravating factors: Stair climbing Relieving factors: Hot pack on low back helps a little.  PRECAUTIONS: Fall and Other: [redacted] weeks pregnant.  RED FLAGS: None   WEIGHT BEARING RESTRICTIONS: No  FALLS:  Has patient fallen in last 6 months? No  LIVING ENVIRONMENT: Lives with: lives with their family Lives in: House/apartment Stairs: Yes: Internal: 14 steps; on right going up and External: 4 steps; can reach both Has following equipment at home: None  OCCUPATION: CNA at [employer].  PLOF: Independent  PATIENT GOALS: Patient would like help with her pain and make her delivery easier.  NEXT MD VISIT: weekly  OBJECTIVE:  Note: Objective measures were completed at Evaluation unless otherwise noted.  DIAGNOSTIC FINDINGS: N/A  PATIENT SURVEYS:  Modified Oswestry 32%   COGNITION: Overall cognitive status: Within functional limits for tasks assessed     SENSATION: Patient reports numbness on lateral R leg to calf.  EDEMA:  Patient denies edema  MUSCLE LENGTH: Deferred due to inability to achieve position  POSTURE: rounded shoulders, increased lumbar lordosis, and anterior pelvic tilt Seated, maintains B hip abduction due to discomfort from her extended abdomen/baby. She reports feeling pressure in her perineal area as well as low back, buttocks.  PALPATION: TTP along  B SI joints and gluts, piriformis, R > L  LOWER EXTREMITY ROM: Hips deferred due to her pregnancy and pain, knees and ankles appear WNL  LOWER EXTREMITY MMT: Hips deferred, knees and ankles 5/5   LOWER EXTREMITY SPECIAL TESTS:  deferred  GAIT: Distance walked: In clinic Assistive device utilized: None Level of assistance: Complete  Independence Comments: Increased lateral sway, slow, laborious.                                                                                                                              TREATMENT DATE:  03/13/23 Reviewed deep breathing with kegel exercise. Quadruped diaphragmatic breathing and kegels L sidelying STM to R piriformis and sacral paraspinal muscles to release spasming, then active stretch with STM to lengthen piriformis L sidelying iso hip abd with 5 sec holds x 4, then iso add x 4 L sidelying clamshells. Standing pelvic tilt against wall with mini squat.  03/11/23 Education    PATIENT EDUCATION:  Education details: POC, ways to avoid aggravating her SPD including hip abd, SLS, how to climb stairs sideways. Initiated deep breathing with kegels so she can familiarize herself with the technique. Person educated: Patient Education method: Explanation Education comprehension: verbalized understanding  HOME EXERCISE PROGRAM: TBD  ASSESSMENT:  CLINICAL IMPRESSION: Patient is a 38 y.o. who was seen today for physical therapy evaluation and treatment for ruptured pubic symphysis and R sciatic nerve pain. Treatment today built on her diaphragmatic breathing with kegels in multiple positions to improve her pelvic floor strength. Also provided STM to R piriformis and gluts along with stretch to reduce spasm and pain. HEP updated to include the pelvic floor activities with return demo of each.  OBJECTIVE IMPAIRMENTS: Abnormal gait, decreased activity tolerance, decreased balance, decreased coordination, decreased endurance, difficulty walking, decreased ROM, decreased strength, dizziness, impaired flexibility, and pain.   ACTIVITY LIMITATIONS: carrying, lifting, bending, standing, squatting, sleeping, stairs, transfers, and locomotion level  PARTICIPATION LIMITATIONS: meal prep, cleaning, laundry, driving, shopping, community activity, and occupation  PERSONAL FACTORS:  Past/current experiences are also affecting patient's functional outcome.   REHAB POTENTIAL: Good  CLINICAL DECISION MAKING: Evolving/moderate complexity  EVALUATION COMPLEXITY: Moderate   GOALS: Goals reviewed with patient? Yes  SHORT TERM GOALS: Target date: 03/27/22 I with initial HEP Baseline: Goal status: 03/13/23-HEP provided, ongoing  LONG TERM GOALS: Target date: 05/13/23  I with final HEP Baseline:  Goal status: INITIAL  2.  Patient will score < 20 on Owestry Baseline: 32% Goal status: INITIAL  3.  Patient will report 50% improvement in her R LE numbness Baseline:  Goal status: INITIAL  4.  Patient will report 50% improvement in her low back , B buttock pain Baseline: 7/10 Goal status: INITIAL  PLAN:  PT FREQUENCY: 2x/week  PT DURATION: other: 9 weeks  PLANNED INTERVENTIONS: 97110-Therapeutic exercises, 97530- Therapeutic activity, [id number]- Neuromuscular re-education, 97535- Self Care, [id number]- Manual therapy, [phone]- Gait training, Patient/Family education, Balance training, Stair training, Taping,  Cryotherapy, and Moist heat  PLAN FOR NEXT SESSION: HEP for PSD   Devere CHRISTELLA Mean, DPT 03/13/2023, 4:29 PM

## 2023-03-21 ENCOUNTER — Encounter: Payer: Self-pay | Admitting: Physical Therapy

## 2023-03-21 ENCOUNTER — Ambulatory Visit: Payer: Medicaid Other | Admitting: Physical Therapy

## 2023-03-21 DIAGNOSIS — R262 Difficulty in walking, not elsewhere classified: Secondary | ICD-10-CM

## 2023-03-21 DIAGNOSIS — S334XXA Traumatic rupture of symphysis pubis, initial encounter: Secondary | ICD-10-CM

## 2023-03-21 DIAGNOSIS — M6281 Muscle weakness (generalized): Secondary | ICD-10-CM

## 2023-03-21 DIAGNOSIS — R278 Other lack of coordination: Secondary | ICD-10-CM

## 2023-03-21 DIAGNOSIS — M5431 Sciatica, right side: Secondary | ICD-10-CM

## 2023-03-21 NOTE — Therapy (Signed)
OUTPATIENT PHYSICAL THERAPY LOWER EXTREMITY EVALUATION   Patient Name: Chelsea Ballard MRN: [id number] DOB:[birth year], 38 y.o., female Today's Date: 03/21/2023  END OF SESSION:  PT End of Session - 03/21/23 1151     Visit Number 3    Date for PT Re-Evaluation 05/13/23    PT Start Time 1108    PT Stop Time 1143    PT Time Calculation (min) 35 min    Equipment Utilized During Treatment Back brace    Activity Tolerance Patient limited by pain    Behavior During Therapy [facility] for tasks assessed/performed               Past Medical History:  Diagnosis Date   Gestational diabetes    Migraine    Past Surgical History:  Procedure Laterality Date   TONSILLECTOMY     TOOTH EXTRACTION  2017   Patient Active Problem List   Diagnosis Date Noted   Fatigue 12/15/2020   Hypoglycemia 12/17/2019   Preterm premature rupture of membranes (PPROM) delivered, current hospitalization 09/27/2019   Abnormal glucose tolerance test (GTT) during pregnancy, antepartum 09/01/2019   Hemorrhoids 05/15/2010   Constipation 05/15/2010   Helicobacter pylori infection 03/26/2010   ACID REFLUX DISEASE 03/23/2010   FOLLICULITIS 03/23/2010   ACUTE PHARYNGITIS 04/06/2007   CONTACT DERMATITIS 04/06/2007    PCP: Sigmund Hazel, MD   REFERRING PROVIDER: Gerald Leitz, MD   REFERRING DIAG:  S33.4XXA (ICD-10-CM) - Traumatic rupture of symphysis pubis, initial encounter  M54.30 (ICD-10-CM) - Sciatica, unspecified side    THERAPY DIAG:  Muscle weakness (generalized)  Other lack of coordination  Difficulty in walking, not elsewhere classified  Dislocation of symphysis pubis, initial encounter  Sciatica of right side  Rationale for Evaluation and Treatment: Rehabilitation  ONSET DATE: 02/12/23  SUBJECTIVE:   SUBJECTIVE STATEMENT: Patient reports that her pelvic pain seems to be slightly improved, but still present.   PERTINENT HISTORY: Per referring provider note: Patient is [redacted] weeks  pregnant. Sustained rupture of symphysis pubis and sciatic pain. PAIN:  Are you having pain? Yes: NPRS scale: 7+ Pain location: B LE, R leg feels like it will give way. She did have prior back pain. Pain description: pressure Aggravating factors: Stair climbing Relieving factors: Hot pack on low back helps a little.  PRECAUTIONS: Fall and Other: [redacted] weeks pregnant.  RED FLAGS: None   WEIGHT BEARING RESTRICTIONS: No  FALLS:  Has patient fallen in last 6 months? No  LIVING ENVIRONMENT: Lives with: lives with their family Lives in: House/apartment Stairs: Yes: Internal: 14 steps; on right going up and External: 4 steps; can reach both Has following equipment at home: None  OCCUPATION: CNA at [employer].  PLOF: Independent  PATIENT GOALS: Patient would like help with her pain and make her delivery easier.  NEXT MD VISIT: weekly  OBJECTIVE:  Note: Objective measures were completed at Evaluation unless otherwise noted.  DIAGNOSTIC FINDINGS: N/A  PATIENT SURVEYS:  Modified Oswestry 32%   COGNITION: Overall cognitive status: Within functional limits for tasks assessed     SENSATION: Patient reports numbness on lateral R leg to calf.  EDEMA:  Patient denies edema  MUSCLE LENGTH: Deferred due to inability to achieve position  POSTURE: rounded shoulders, increased lumbar lordosis, and anterior pelvic tilt Seated, maintains B hip abduction due to discomfort from her extended abdomen/baby. She reports feeling pressure in her perineal area as well as low back, buttocks.  PALPATION: TTP along B SI joints and gluts, piriformis, R > L  LOWER EXTREMITY ROM: Hips deferred due to her pregnancy and pain, knees and ankles appear WNL  LOWER EXTREMITY MMT: Hips deferred, knees and ankles 5/5   LOWER EXTREMITY SPECIAL TESTS:  deferred  GAIT: Distance walked: In clinic Assistive device utilized: None Level of assistance: Complete Independence Comments: Increased lateral sway,  slow, laborious.                                                                                                                              TREATMENT DATE:  03/21/23 Seated ball squeeze, 10 x 5 sec holds Sidelying reverse clamshell to activate hip adductors-modified from hip add due to pain.- 10 reps each side Deep STM to R piriformis with stretch, patient reported pain, but felt relief after activity. Seated clamshell x 10  03/13/23 Reviewed deep breathing with kegel exercise. Quadruped diaphragmatic breathing and kegels L sidelying STM to R piriformis and sacral paraspinal muscles to release spasming, then active stretch with STM to lengthen piriformis L sidelying iso hip abd with 5 sec holds x 4, then iso add x 4 L sidelying clamshells. Standing pelvic tilt against wall with mini squat.  03/11/23 Education    PATIENT EDUCATION:  Education details: POC, ways to avoid aggravating her SPD including hip abd, SLS, how to climb stairs sideways. Initiated deep breathing with kegels so she can familiarize herself with the technique. Person educated: Patient Education method: Explanation Education comprehension: verbalized understanding  HOME EXERCISE PROGRAM: TBD  ASSESSMENT:  CLINICAL IMPRESSION: Patient is a 38 y.o. who was seen today for physical therapy evaluation and treatment for ruptured pubic symphysis and R sciatic nerve pain.  Patient reports continued pain. She has also developed pain starting in lower chest and moving around under R breast, reports it as burning. She felt the STM and stretch to piriformis was helpful last treatment. Continued to progress strengthening activities for pelvic floor as well as STM for SI dysfunction. She is limited in some activities due to positioning. Also educated her to use tennis ball for self deep STM mobs.  OBJECTIVE IMPAIRMENTS: Abnormal gait, decreased activity tolerance, decreased balance, decreased coordination, decreased endurance,  difficulty walking, decreased ROM, decreased strength, dizziness, impaired flexibility, and pain.   ACTIVITY LIMITATIONS: carrying, lifting, bending, standing, squatting, sleeping, stairs, transfers, and locomotion level  PARTICIPATION LIMITATIONS: meal prep, cleaning, laundry, driving, shopping, community activity, and occupation  PERSONAL FACTORS: Past/current experiences are also affecting patient's functional outcome.   REHAB POTENTIAL: Good  CLINICAL DECISION MAKING: Evolving/moderate complexity  EVALUATION COMPLEXITY: Moderate   GOALS: Goals reviewed with patient? Yes  SHORT TERM GOALS: Target date: 03/27/22 I with initial HEP Baseline: Goal status: 03/13/23-HEP provided, ongoing  LONG TERM GOALS: Target date: 05/13/23  I with final HEP Baseline:  Goal status: INITIAL  2.  Patient will score < 20 on Owestry Baseline: 32% Goal status: INITIAL  3.  Patient will report 50% improvement in her R LE numbness Baseline:  Goal status:  INITIAL  4.  Patient will report 50% improvement in her low back , B buttock pain Baseline: 7/10 Goal status: INITIAL  PLAN:  PT FREQUENCY: 2x/week  PT DURATION: other: 9 weeks  PLANNED INTERVENTIONS: 97110-Therapeutic exercises, 97530- Therapeutic activity, 97112- Neuromuscular re-education, 97535- Self Care, 21308- Manual therapy, [phone]- Gait training, Patient/Family education, Balance training, Stair training, Taping, Cryotherapy, and Moist heat  PLAN FOR NEXT SESSION: HEP for PSD   Iona Beard, DPT 03/21/2023, 11:56 AM

## 2023-03-28 ENCOUNTER — Ambulatory Visit: Payer: Medicaid Other | Admitting: Physical Therapy

## 2023-03-29 ENCOUNTER — Inpatient Hospital Stay (HOSPITAL_COMMUNITY)
Admission: AD | Admit: 2023-03-29 | Discharge: 2023-03-29 | Disposition: A | Discharge status: Home / Self Care | Payer: Medicaid Other | Attending: Obstetrics and Gynecology | Admitting: Obstetrics and Gynecology

## 2023-03-29 ENCOUNTER — Encounter (HOSPITAL_COMMUNITY): Payer: Self-pay

## 2023-03-29 DIAGNOSIS — O99891 Other specified diseases and conditions complicating pregnancy: Secondary | ICD-10-CM

## 2023-03-29 DIAGNOSIS — M549 Dorsalgia, unspecified: Secondary | ICD-10-CM

## 2023-03-29 DIAGNOSIS — Z3689 Encounter for other specified antenatal screening: Secondary | ICD-10-CM

## 2023-03-29 DIAGNOSIS — Z3A33 33 weeks gestation of pregnancy: Secondary | ICD-10-CM | POA: Diagnosis not present

## 2023-03-29 DIAGNOSIS — O09523 Supervision of elderly multigravida, third trimester: Secondary | ICD-10-CM | POA: Diagnosis present

## 2023-03-29 LAB — URINALYSIS, ROUTINE W REFLEX MICROSCOPIC
Bilirubin Urine: NEGATIVE
Glucose, UA: NEGATIVE mg/dL
Hgb urine dipstick: NEGATIVE
Ketones, ur: 5 mg/dL — AB
Nitrite: NEGATIVE
Protein, ur: NEGATIVE mg/dL
Specific Gravity, Urine: 1.01 (ref 1.005–1.030)
pH: 7 (ref 5.0–8.0)

## 2023-03-29 MED ORDER — ACETAMINOPHEN 325 MG PO TABS
650.0000 mg | ORAL_TABLET | Freq: Once | ORAL | Status: AC
  Administered 2023-03-29: 650 mg via ORAL
  Filled 2023-03-29: qty 2

## 2023-03-29 MED ORDER — CYCLOBENZAPRINE HCL 5 MG PO TABS
10.0000 mg | ORAL_TABLET | Freq: Once | ORAL | Status: AC
  Administered 2023-03-29: 10 mg via ORAL
  Filled 2023-03-29: qty 2

## 2023-03-29 NOTE — MAU Note (Signed)
..  Chelsea Ballard is a 38 y.o. at [redacted]w[redacted]d here in MAU reporting: back pain that started around 2pm  Has not taken any medication for the pain.  Denies vaginal bleeding or leaking of fluid.  +FM  Pain score: 5/10 Vitals:   03/29/23 0138  BP: 106/74  Pulse: 86  Resp: 17  Temp: 98.2 F (36.8 C)  SpO2: 100%     FHT:130 Lab orders placed from triage: UA

## 2023-03-29 NOTE — MAU Provider Note (Signed)
History     CSN: [id number]  Arrival date and time: 03/29/23 0116   Event Date/Time   First Provider Initiated Contact with Patient 03/29/23 [id number]      Chief Complaint  Patient presents with   Back Pain   Chelsea Ballard is a 38 y.o. [id number] at [redacted]w[redacted]d who receives care at [facility].  She presents today for back pain.  Patient states she started having constant back pain since 2pm that is burning and shoots to her hips.  She states the pain is worse with movement and causes increased pelvic pressure.  She states she has not taken anything for the pain, but has increased her water intake and rest with no improvement. Patient rates the pain a 5/10.  Patient endorses fetal movement and feels "baby moves too much and it causes me pain."  Patient states today she had one contractions around 2pm, but none since. No vaginal discharge or bleeding.   OB History     Gravida  3   Para  2   Term  1   Preterm  1   AB  0   Living  2      SAB  0   IAB      Ectopic      Multiple  0   Live Births  1           Past Medical History:  Diagnosis Date   Gestational diabetes    Migraine     Past Surgical History:  Procedure Laterality Date   TONSILLECTOMY     TOOTH EXTRACTION  2017    Family History  Problem Relation Age of Onset   Diabetes Paternal Grandmother    Diabetes Paternal Grandfather     Social History   Tobacco Use   Smoking status: Never   Smokeless tobacco: Never  Vaping Use   Vaping status: Never Used  Substance Use Topics   Alcohol use: No   Drug use: No    Allergies:  Allergies  Allergen Reactions   Peanut-Containing Drug Products Rash    Medications Prior to Admission  Medication Sig Dispense Refill Last Dose/Taking   magnesium 30 MG tablet Take 30 mg by mouth 2 (two) times daily.   03/28/2023   Prenat w/o A-FeCb-FeGl-DSS-FA (CITRANATAL RX) 27-1 MG TABS Take 1 tablet by mouth daily.   03/28/2023   acetaminophen (TYLENOL) 325 MG tablet Take  2 tablets (650 mg total) by mouth every 6 (six) hours as needed for mild pain or moderate pain (for pain scale < 4). 30 tablet 0    benzonatate (TESSALON) 100 MG capsule Take 1 capsule (100 mg total) by mouth 3 (three) times daily as needed for cough. 30 capsule 0    cetirizine (ZYRTEC ALLERGY) 10 MG tablet Take 1 tablet (10 mg total) by mouth daily. 30 tablet 0    guaiFENesin 200 MG tablet Take 1 tablet (200 mg total) by mouth every 4 (four) hours as needed for cough or to loosen phlegm. 30 tablet 0    ibuprofen (ADVIL) 600 MG tablet Take 1 tablet (600 mg total) by mouth every 6 (six) hours. 30 tablet 0    omeprazole (PRILOSEC) 20 MG capsule Take 20 mg by mouth 2 (two) times daily.      oxyCODONE (OXY IR/ROXICODONE) 5 MG immediate release tablet Take 1 tablet (5 mg total) by mouth every 6 (six) hours as needed for severe pain or breakthrough pain (pain scale 4-7). 6 tablet 0  promethazine-dextromethorphan (PROMETHAZINE-DM) 6.25-15 MG/5ML syrup Take 5 mLs by mouth 3 (three) times daily as needed for cough. 100 mL 0    pseudoephedrine (SUDAFED) 60 MG tablet Take 1 tablet (60 mg total) by mouth every 8 (eight) hours as needed for congestion. 30 tablet 0     Review of Systems  Gastrointestinal:  Negative for constipation, diarrhea, nausea and vomiting.  Genitourinary:  Positive for pelvic pain (Pressure). Negative for difficulty urinating, dysuria, vaginal bleeding and vaginal discharge.  Musculoskeletal:  Positive for back pain.  Neurological:  Positive for headaches (Earlier, None currently).   Physical Exam   Blood pressure 106/74, pulse 86, temperature 98.2 F (36.8 C), temperature source Oral, resp. rate 17, height 5\' 1"  (1.549 m), weight 72.2 kg, last menstrual period 08/07/2022, SpO2 100%.  Physical Exam Vitals reviewed. Exam conducted with a chaperone present Darlina Rumpf, RN).  Constitutional:      Appearance: Normal appearance.  HENT:     Head: Normocephalic and atraumatic.  Eyes:      Conjunctiva/sclera: Conjunctivae normal.  Cardiovascular:     Rate and Rhythm: Normal rate.  Pulmonary:     Effort: Pulmonary effort is normal. No respiratory distress.  Abdominal:     Palpations: Abdomen is soft.     Comments: Gravid, Appears LGA  Musculoskeletal:     Cervical back: Normal range of motion.  Neurological:     Mental Status: She is alert and oriented to person, place, and time.  Psychiatric:        Mood and Affect: Mood normal.        Behavior: Behavior normal.     Fetal Assessment 125 bpm, Mod Var, -Decels, +15x15 Accels Toco: Two ctx graphed  MAU Course   Results for orders placed or performed during the hospital encounter of 03/29/23 (from the past 24 hours)  Urinalysis, Routine w reflex microscopic -Urine, Clean Catch     Status: Abnormal   Collection Time: 03/29/23  1:30 AM  Result Value Ref Range   Color, Urine YELLOW YELLOW   APPearance CLOUDY (A) CLEAR   Specific Gravity, Urine 1.010 1.005 - 1.030   pH 7.0 5.0 - 8.0   Glucose, UA NEGATIVE NEGATIVE mg/dL   Hgb urine dipstick NEGATIVE NEGATIVE   Bilirubin Urine NEGATIVE NEGATIVE   Ketones, ur 5 (A) NEGATIVE mg/dL   Protein, ur NEGATIVE NEGATIVE mg/dL   Nitrite NEGATIVE NEGATIVE   Leukocytes,Ua LARGE (A) NEGATIVE   RBC / HPF 0-5 0 - 5 RBC/hpf   WBC, UA 21-50 0 - 5 WBC/hpf   Bacteria, UA RARE (A) NONE SEEN   Squamous Epithelial / HPF 11-20 0 - 5 /HPF   Mucus PRESENT    Non Squamous Epithelial 0-5 (A) NONE SEEN   No results found.  MDM PE Labs: UA EFM Muscle Relaxant Analgesic  Assessment and Plan  38 year old [id number]  SIUP at 33.4 weeks Cat I FT Back Pain   -POC Reviewed -Exam performed. -Patient offered and declines pain medication d/t safety concerns. -Reviewed safety of medications during pregnancy.  Patient will consider. -Give heating pads and reassess.  Cherre Robins MSN, CNM 03/29/2023, 0145  Reassessment (3:14 AM) -NST reactive, monitoring discontinued.  -Patient  reports pain improved with heating pad. -Reports she has had one contraction since arrival. Reassurance given. -Discussed pain medication and patient still unsure. -Will give additional heating pads. -Informed that if no further interventions she would be discharged with follow up as scheduled.   Reassessment (3:34 AM) -Patient requesting  pain medication. -Flexeril and tylenol ordered.   Reassessment (4:15 AM) -Patient reports improvement in pain. -Nurse to give precautions. -Encouraged to call primary office or return to MAU if symptoms worsen or with the onset of new symptoms. -Discharged to home in improved condition.  Cherre Robins MSN, CNM Advanced Practice Provider, [facility] [employer] [facility]

## 2023-04-08 ENCOUNTER — Ambulatory Visit: Payer: Medicaid Other | Admitting: Physical Therapy

## 2023-04-09 ENCOUNTER — Encounter (HOSPITAL_COMMUNITY): Payer: Self-pay | Admitting: Obstetrics and Gynecology

## 2023-04-09 ENCOUNTER — Inpatient Hospital Stay (HOSPITAL_COMMUNITY)
Admission: AD | Admit: 2023-04-09 | Discharge: 2023-04-10 | Disposition: A | Discharge status: Home / Self Care | Payer: Medicaid Other | Attending: Obstetrics and Gynecology | Admitting: Obstetrics and Gynecology

## 2023-04-09 DIAGNOSIS — O4703 False labor before 37 completed weeks of gestation, third trimester: Secondary | ICD-10-CM | POA: Insufficient documentation

## 2023-04-09 DIAGNOSIS — O09523 Supervision of elderly multigravida, third trimester: Secondary | ICD-10-CM | POA: Insufficient documentation

## 2023-04-09 DIAGNOSIS — R110 Nausea: Secondary | ICD-10-CM

## 2023-04-09 DIAGNOSIS — M549 Dorsalgia, unspecified: Secondary | ICD-10-CM

## 2023-04-09 DIAGNOSIS — Z3A35 35 weeks gestation of pregnancy: Secondary | ICD-10-CM | POA: Insufficient documentation

## 2023-04-09 DIAGNOSIS — O212 Late vomiting of pregnancy: Secondary | ICD-10-CM | POA: Insufficient documentation

## 2023-04-09 DIAGNOSIS — O26893 Other specified pregnancy related conditions, third trimester: Secondary | ICD-10-CM | POA: Insufficient documentation

## 2023-04-09 NOTE — MAU Note (Signed)
 Chelsea Ballard is a 38 y.o. at [redacted]w[redacted]d here in MAU reporting:  ctx that started at 2230 tonight. Pt states they are less than 5 minutes apart. Pt states she has pressure in her vagina and on the right side of her abdomen. Pt states she also has upper back pain that started yesterday that comes and goes. Pt denies LOF or VB. +FM  Pt states she threw up once tonight at 2300. Pt denies taking anything for N/V  Onset of complaint: 2230 Pain score: 6/10 lower abdomen  4/10 upper back  Vitals:   04/09/23 2359  BP: 113/69  Pulse: 92  Resp: 18  Temp: 98.3 F (36.8 C)  SpO2: 99%     FHT:137 Lab orders placed from triage:

## 2023-04-10 DIAGNOSIS — O99891 Other specified diseases and conditions complicating pregnancy: Secondary | ICD-10-CM | POA: Diagnosis not present

## 2023-04-10 DIAGNOSIS — O4703 False labor before 37 completed weeks of gestation, third trimester: Secondary | ICD-10-CM

## 2023-04-10 DIAGNOSIS — R110 Nausea: Secondary | ICD-10-CM | POA: Diagnosis not present

## 2023-04-10 DIAGNOSIS — Z3A35 35 weeks gestation of pregnancy: Secondary | ICD-10-CM

## 2023-04-10 DIAGNOSIS — O212 Late vomiting of pregnancy: Secondary | ICD-10-CM | POA: Diagnosis not present

## 2023-04-10 DIAGNOSIS — M549 Dorsalgia, unspecified: Secondary | ICD-10-CM

## 2023-04-10 DIAGNOSIS — O09523 Supervision of elderly multigravida, third trimester: Secondary | ICD-10-CM | POA: Diagnosis not present

## 2023-04-10 DIAGNOSIS — O26893 Other specified pregnancy related conditions, third trimester: Secondary | ICD-10-CM | POA: Diagnosis not present

## 2023-04-10 MED ORDER — ONDANSETRON 4 MG PO TBDP
4.0000 mg | ORAL_TABLET | Freq: Once | ORAL | Status: AC
  Administered 2023-04-10: 4 mg via ORAL
  Filled 2023-04-10: qty 1

## 2023-04-10 MED ORDER — CYCLOBENZAPRINE HCL 5 MG PO TABS
5.0000 mg | ORAL_TABLET | Freq: Once | ORAL | Status: AC
  Administered 2023-04-10: 5 mg via ORAL
  Filled 2023-04-10: qty 1

## 2023-04-10 MED ORDER — NIFEDIPINE 10 MG PO CAPS
10.0000 mg | ORAL_CAPSULE | ORAL | Status: DC | PRN
  Administered 2023-04-10 (×3): 10 mg via ORAL
  Filled 2023-04-10 (×4): qty 1

## 2023-04-10 MED ORDER — CYCLOBENZAPRINE HCL 5 MG PO TABS: 5.0000 mg | ORAL_TABLET | Freq: Four times a day (QID) | ORAL | 0 refills | Status: AC | PRN

## 2023-04-10 NOTE — MAU Provider Note (Addendum)
 Chief Complaint:  Contractions   Event Date/Time   First Provider Initiated Contact with Patient 04/10/23 0036     HPI: Chelsea Ballard is a 38 y.o. [id number] at 55w2dwho presents to maternity admissions reporting preterm contractions tonight..  Also has some back pain and vomited once. . She reports good fetal movement, denies LOF, vaginal bleeding, vaginal itching/burning, urinary symptoms, h/a, dizziness, n/v, diarrhea, constipation or fever/chills.  She denies headache, visual changes or RUQ abdominal pain.  Abdominal Pain This is a new problem. The current episode started today. The quality of the pain is cramping. The abdominal pain does not radiate. Pertinent negatives include no constipation, diarrhea, dysuria, fever or frequency. Nothing aggravates the pain. The pain is relieved by Nothing. She has tried nothing for the symptoms.   RN Note Chelsea Ballard is a 38 y.o. at [redacted]w[redacted]d here in MAU reporting:  ctx that started at 2230 tonight. Pt states they are less than 5 minutes apart. Pt states she has pressure in her vagina and on the right side of her abdomen. Pt states she also has upper back pain that started yesterday that comes and goes. Pt denies LOF or VB. +FM  Pt states she threw up once tonight at 2300. Pt denies taking anything for N/V  Onset of complaint: 2230 Pain score: 6/10 lower abdomen    Past Medical History: Past Medical History:  Diagnosis Date   Gestational diabetes    Migraine     Past obstetric history: OB History  Gravida Para Term Preterm AB Living  3 2 1 1  0 2  SAB IAB Ectopic Multiple Live Births  0   0 1    # Outcome Date GA Lbr Len/2nd Weight Sex Type Anes PTL Lv  3 Current           2 Preterm 09/27/19 [redacted]w[redacted]d 02:07 / 00:04 1920 g M Vag-Spont None  LIV  1 Term 2018 [redacted]w[redacted]d    Vag-Spont       Past Surgical History: Past Surgical History:  Procedure Laterality Date   TONSILLECTOMY     TOOTH EXTRACTION  2017    Family History: Family History  Problem  Relation Age of Onset   Diabetes Paternal Grandmother    Diabetes Paternal Grandfather     Social History: Social History   Tobacco Use   Smoking status: Never   Smokeless tobacco: Never  Vaping Use   Vaping status: Never Used  Substance Use Topics   Alcohol use: No   Drug use: No    Allergies:  Allergies  Allergen Reactions   Peanut-Containing Drug Products Rash    Meds:  Medications Prior to Admission  Medication Sig Dispense Refill Last Dose/Taking   magnesium 30 MG tablet Take 30 mg by mouth 2 (two) times daily.   04/09/2023   Prenat w/o A-FeCb-FeGl-DSS-FA (CITRANATAL RX) 27-1 MG TABS Take 1 tablet by mouth daily.   04/09/2023   acetaminophen  (TYLENOL ) 325 MG tablet Take 2 tablets (650 mg total) by mouth every 6 (six) hours as needed for mild pain or moderate pain (for pain scale < 4). 30 tablet 0    cetirizine  (ZYRTEC  ALLERGY) 10 MG tablet Take 1 tablet (10 mg total) by mouth daily. 30 tablet 0    omeprazole (PRILOSEC) 20 MG capsule Take 20 mg by mouth 2 (two) times daily.       I have reviewed patient's Past Medical Hx, Surgical Hx, Family Hx, Social Hx, medications and allergies.   ROS:  Review of Systems  Constitutional:  Negative for fever.  Gastrointestinal:  Positive for abdominal pain. Negative for constipation and diarrhea.  Genitourinary:  Negative for dysuria and frequency.   Other systems negative  Physical Exam  Patient Vitals for the past 24 hrs:  BP Temp Temp src Pulse Resp SpO2 Height Weight  04/09/23 2359 113/69 98.3 F (36.8 C) Oral 92 18 99 % 5' 1 (1.549 m) 73 kg   Constitutional: Well-developed, well-nourished female in no acute distress.  Cardiovascular: normal rate and rhythm Respiratory: normal effort GI: Abd soft, non-tender, gravid appropriate for gestational age.   No rebound or guarding. MS: Extremities nontender, no edema, normal ROM Neurologic: Alert and oriented x 4.  GU: Neg CVAT.  PELVIC EXAM: Dilation: 1 Effacement (%):  40 Station: Ballotable Presentation: Vertex Exam by:: Earnie Pouch, CNM    FHT:  Baseline 130 , moderate variability, accelerations present, no decelerations Contractions: q 8 mins Irregular     Labs: No results found for this or any previous visit (from the past 24 hours).     Imaging:  No results found.  MAU Course/MDM: I have reviewed the triage vital signs and the nursing notes.   Pertinent labs & imaging results that were available during my care of the patient were reviewed by me and considered in my medical decision making (see chart for details).      I have reviewed her medical records including past results, notes and treatments.   I have ordered labs and reviewed results.  NST reviewed   Treatments in MAU included zofran  for nausea and Procardia  series for PTL    Contractions got less frequent.  Still had a lot of back pain  States Flexeril  helped last time she was here  Given one dose here and will Rx for prn use at home.   Assessment: Single IUP at [redacted]w[redacted]d Preterm Uterine Contractions Nausea Back pain  Plan: Discharge home Preterm Labor precautions and fetal kick counts Rx Flexeril  for use for back pain as needed Follow up in Office for prenatal visits and recheck Encouraged to return if she develops worsening of symptoms, increase in pain, fever, or other concerning symptoms.   Pt stable at time of discharge.  Earnie Pouch CNM, MSN Certified Nurse-Midwife 04/10/2023 12:36 AM

## 2023-04-13 ENCOUNTER — Inpatient Hospital Stay (HOSPITAL_COMMUNITY)
Admission: AD | Admit: 2023-04-13 | Discharge: 2023-04-14 | Disposition: A | Discharge status: Home / Self Care | Payer: Medicaid Other | Attending: Family Medicine | Admitting: Family Medicine

## 2023-04-13 ENCOUNTER — Encounter (HOSPITAL_COMMUNITY): Payer: Self-pay | Admitting: Family Medicine

## 2023-04-13 DIAGNOSIS — Z3A35 35 weeks gestation of pregnancy: Secondary | ICD-10-CM | POA: Diagnosis not present

## 2023-04-13 DIAGNOSIS — A084 Viral intestinal infection, unspecified: Secondary | ICD-10-CM

## 2023-04-13 DIAGNOSIS — O212 Late vomiting of pregnancy: Secondary | ICD-10-CM | POA: Diagnosis not present

## 2023-04-13 DIAGNOSIS — O09523 Supervision of elderly multigravida, third trimester: Secondary | ICD-10-CM | POA: Diagnosis not present

## 2023-04-13 DIAGNOSIS — O4700 False labor before 37 completed weeks of gestation, unspecified trimester: Secondary | ICD-10-CM

## 2023-04-13 DIAGNOSIS — O218 Other vomiting complicating pregnancy: Secondary | ICD-10-CM | POA: Insufficient documentation

## 2023-04-13 LAB — URINALYSIS, ROUTINE W REFLEX MICROSCOPIC
Bilirubin Urine: NEGATIVE
Glucose, UA: NEGATIVE mg/dL
Hgb urine dipstick: NEGATIVE
Ketones, ur: 20 mg/dL — AB
Leukocytes,Ua: NEGATIVE
Nitrite: NEGATIVE
Protein, ur: NEGATIVE mg/dL
Specific Gravity, Urine: 1.016 (ref 1.005–1.030)
pH: 6 (ref 5.0–8.0)

## 2023-04-13 LAB — CBC
HCT: 39.7 % (ref 36.0–46.0)
Hemoglobin: 13.4 g/dL (ref 12.0–15.0)
MCH: 29.9 pg (ref 26.0–34.0)
MCHC: 33.8 g/dL (ref 30.0–36.0)
MCV: 88.6 fL (ref 80.0–100.0)
Platelets: 255 10*3/uL (ref 150–400)
RBC: 4.48 MIL/uL (ref 3.87–5.11)
RDW: 12.9 % (ref 11.5–15.5)
WBC: 7.6 10*3/uL (ref 4.0–10.5)
nRBC: 0 % (ref 0.0–0.2)

## 2023-04-13 LAB — COMPREHENSIVE METABOLIC PANEL
ALT: 33 U/L (ref 0–44)
AST: 45 U/L — ABNORMAL HIGH (ref 15–41)
Albumin: 2.3 g/dL — ABNORMAL LOW (ref 3.5–5.0)
Alkaline Phosphatase: 271 U/L — ABNORMAL HIGH (ref 38–126)
Anion gap: 9 (ref 5–15)
BUN: <5 mg/dL — ABNORMAL LOW (ref 6–20)
CO2: 19 mmol/L — ABNORMAL LOW (ref 22–32)
Calcium: 8.5 mg/dL — ABNORMAL LOW (ref 8.9–10.3)
Chloride: 105 mmol/L (ref 98–111)
Creatinine, Ser: 0.42 mg/dL — ABNORMAL LOW (ref 0.44–1.00)
GFR, Estimated: >60 mL/min (ref >60)
Glucose, Bld: 115 mg/dL — ABNORMAL HIGH (ref 70–99)
Potassium: 3.4 mmol/L — ABNORMAL LOW (ref 3.5–5.1)
Sodium: 133 mmol/L — ABNORMAL LOW (ref 135–145)
Total Bilirubin: 0.7 mg/dL (ref 0.0–1.2)
Total Protein: 7 g/dL (ref 6.5–8.1)

## 2023-04-13 LAB — WET PREP, GENITAL
Clue Cells Wet Prep HPF POC: NONE SEEN
Sperm: NONE SEEN
Trich, Wet Prep: NONE SEEN
WBC, Wet Prep HPF POC: <10 (ref <10)
Yeast Wet Prep HPF POC: NONE SEEN

## 2023-04-13 MED ORDER — ONDANSETRON 4 MG PO TBDP
4.0000 mg | ORAL_TABLET | Freq: Once | ORAL | Status: DC
  Filled 2023-04-13: qty 1

## 2023-04-13 MED ORDER — LACTATED RINGERS IV BOLUS
1000.0000 mL | Freq: Once | INTRAVENOUS | Status: AC
  Administered 2023-04-13: 1000 mL via INTRAVENOUS

## 2023-04-13 NOTE — MAU Note (Signed)
.  Chelsea Ballard is a 38 y.o. at [redacted]w[redacted]d here in MAU reporting: had diarrhea last night but none today. Vomited once this morning. Was better had some soup this afternoon but then vomited again and vomited a few more times. Started having some ctx about 7 min apart.  Reports some chills and hot flashes. Good fetal movement felt. Denies any vag bleeding or discharge.   LMP:  Onset of complaint: today Pain score: 6-7 Vitals:   04/13/23 2147  BP: 110/61  Resp: 18  Temp: 98.2 F (36.8 C)     FHT: 165  Lab orders placed from triage: u/a

## 2023-04-13 NOTE — MAU Provider Note (Signed)
 History     CSN: [id number]  Arrival date and time: 04/13/23 2133   Event Date/Time   First Provider Initiated Contact with Patient 04/13/2023  9:45 PM   No chief complaint on file.   HPI  ZELDA REAMES is a 38 y.o. [id number] at [redacted]w[redacted]d who presents to the MAU for diarrhea, vomiting, and contractions. States she has had recurrent bouts of diarrhea yesterday. Woke up around 1000 today, had breakfast, then immediately vomited. She tried to stay hydrated, but would vomit 30-40 min after drinking fluids. She tried to eat lunch, but again vomited. After she vomited around 1300 during lunch, she started experiencing ctx every 5 minutes since around 1300. She denies fevers, sick contacts, other abdominal pain, urinary sxs. She does work around patients for work. She endorses chills. No VB, LOF. Vigorous FM.  Past Medical History:  Diagnosis Date   Gestational diabetes    Migraine     Past Surgical History:  Procedure Laterality Date   TONSILLECTOMY     TOOTH EXTRACTION  2017    Family History  Problem Relation Age of Onset   Diabetes Paternal Grandmother    Diabetes Paternal Grandfather     Social History   Tobacco Use   Smoking status: Never   Smokeless tobacco: Never  Vaping Use   Vaping status: Never Used  Substance Use Topics   Alcohol use: No   Drug use: No    Allergies:  Allergies  Allergen Reactions   Peanut-Containing Drug Products Rash    Medications Prior to Admission  Medication Sig Dispense Refill Last Dose/Taking   acetaminophen  (TYLENOL ) 325 MG tablet Take 2 tablets (650 mg total) by mouth every 6 (six) hours as needed for mild pain or moderate pain (for pain scale < 4). 30 tablet 0 Past Week   magnesium 30 MG tablet Take 30 mg by mouth 2 (two) times daily.   04/12/2023   Prenat w/o A-FeCb-FeGl-DSS-FA (CITRANATAL RX) 27-1 MG TABS Take 1 tablet by mouth daily.   04/12/2023   cetirizine  (ZYRTEC  ALLERGY) 10 MG tablet Take 1 tablet (10 mg total) by mouth daily. 30  tablet 0 More than a month   cyclobenzaprine  (FLEXERIL ) 5 MG tablet Take 1 tablet (5 mg total) by mouth every 6 (six) hours as needed for muscle spasms. 30 tablet 0    omeprazole (PRILOSEC) 20 MG capsule Take 20 mg by mouth 2 (two) times daily.   More than a month    ROS reviewed and pertinent positives and negatives as documented in HPI.  Physical Exam   Blood pressure 121/83, pulse (!) 113, temperature 98.2 F (36.8 C), resp. rate 18, height 5' 1 (1.549 m), weight 72.1 kg, last menstrual period 08/07/2022.  Physical Exam Constitutional:      General: She is not in acute distress.    Appearance: Normal appearance. She is not ill-appearing.  HENT:     Head: Normocephalic and atraumatic.  Cardiovascular:     Rate and Rhythm: Normal rate.  Pulmonary:     Effort: Pulmonary effort is normal.     Breath sounds: Normal breath sounds.  Abdominal:     Palpations: Abdomen is soft.     Tenderness: There is no abdominal tenderness. There is no guarding.  Musculoskeletal:        General: Normal range of motion.  Skin:    General: Skin is warm and dry.     Findings: No rash.  Neurological:     General: No focal  deficit present.     Mental Status: She is alert and oriented to person, place, and time.   EFM: 150/mod/+a/-d  MAU Course  Procedures  MDM 38 y.o. [id number] at [redacted]w[redacted]d presenting for preterm uterine contractions in setting of recent nausea/vomiting and diarrhea. Pt well appearing, exam benign and cat I strip. Cx unchanged from prior exam three days ago -- 1/50/-3. Suspect viral gastroenteritis causing dehydration and uterine irritability. Will obtain labs, swabs, and provide IV bolus.  UA with ketones, increasing suspicion for dehydration.  12:31 AM  Labs back -- CBC wnl, CMP with mildly elevated AST -- suspect in setting of dehydration and viral gastroenteritis. No si/sx c/f biliary or pancreatic pathology. Ctx spaced out and less severe. Pt tolerated PO hydration. Pt discharged w  return precautions.   Assessment and Plan  Viral gastroenteritis Rx for Zofran  sent Rehydration with IVF today w improvement of sxs Suspect mild transaminitis 2/2 viral gastro  Preterm uterine contractions Improved w IV rehydration Rec increasing hydration at home    Alain Sor, MD OB Fellow, Faculty Practice [facility]  04/13/2023, 11:23 PM

## 2023-04-14 LAB — GC/CHLAMYDIA PROBE AMP (~~LOC~~) NOT AT ARMC
Chlamydia: NEGATIVE
Comment: NEGATIVE
Comment: NORMAL
Neisseria Gonorrhea: NEGATIVE

## 2023-04-14 MED ORDER — ONDANSETRON 4 MG PO TBDP: 4.0000 mg | ORAL_TABLET | Freq: Three times a day (TID) | ORAL | 0 refills | Status: DC | PRN

## 2023-04-23 ENCOUNTER — Inpatient Hospital Stay (HOSPITAL_COMMUNITY)
Admission: AD | Admit: 2023-04-23 | Discharge: 2023-04-25 | DRG: 807 | Disposition: A | Discharge status: Home / Self Care | Payer: Medicaid Other | Attending: Obstetrics and Gynecology | Admitting: Obstetrics and Gynecology

## 2023-04-23 ENCOUNTER — Other Ambulatory Visit: Payer: Self-pay

## 2023-04-23 ENCOUNTER — Inpatient Hospital Stay (HOSPITAL_COMMUNITY): Payer: Medicaid Other | Admitting: Anesthesiology

## 2023-04-23 ENCOUNTER — Encounter (HOSPITAL_COMMUNITY): Payer: Self-pay | Admitting: Obstetrics and Gynecology

## 2023-04-23 DIAGNOSIS — O26893 Other specified pregnancy related conditions, third trimester: Secondary | ICD-10-CM | POA: Diagnosis present

## 2023-04-23 DIAGNOSIS — O99214 Obesity complicating childbirth: Secondary | ICD-10-CM | POA: Diagnosis present

## 2023-04-23 DIAGNOSIS — Z3A37 37 weeks gestation of pregnancy: Secondary | ICD-10-CM | POA: Diagnosis not present

## 2023-04-23 DIAGNOSIS — Z8632 Personal history of gestational diabetes: Secondary | ICD-10-CM

## 2023-04-23 DIAGNOSIS — O9962 Diseases of the digestive system complicating childbirth: Secondary | ICD-10-CM | POA: Diagnosis present

## 2023-04-23 DIAGNOSIS — K219 Gastro-esophageal reflux disease without esophagitis: Secondary | ICD-10-CM | POA: Diagnosis present

## 2023-04-23 DIAGNOSIS — Z833 Family history of diabetes mellitus: Secondary | ICD-10-CM

## 2023-04-23 LAB — TYPE AND SCREEN
ABO/RH(D): A POS
Antibody Screen: NEGATIVE

## 2023-04-23 LAB — CBC
HCT: 44 % (ref 36.0–46.0)
Hemoglobin: 14.8 g/dL (ref 12.0–15.0)
MCH: 29.7 pg (ref 26.0–34.0)
MCHC: 33.6 g/dL (ref 30.0–36.0)
MCV: 88.4 fL (ref 80.0–100.0)
Platelets: 298 10*3/uL (ref 150–400)
RBC: 4.98 MIL/uL (ref 3.87–5.11)
RDW: 12.9 % (ref 11.5–15.5)
WBC: 7.3 10*3/uL (ref 4.0–10.5)
nRBC: 0 % (ref 0.0–0.2)

## 2023-04-23 LAB — RPR: RPR Ser Ql: NONREACTIVE

## 2023-04-23 MED ORDER — OXYTOCIN BOLUS FROM INFUSION
333.0000 mL | Freq: Once | INTRAVENOUS | Status: AC
  Administered 2023-04-23: 333 mL via INTRAVENOUS

## 2023-04-23 MED ORDER — FENTANYL-BUPIVACAINE-NACL 0.5-0.125-0.9 MG/250ML-% EP SOLN
12.0000 mL/h | EPIDURAL | Status: DC | PRN
  Administered 2023-04-23: 12 mL/h via EPIDURAL

## 2023-04-23 MED ORDER — ONDANSETRON HCL 4 MG PO TABS: 4.0000 mg | ORAL_TABLET | ORAL | Status: DC | PRN

## 2023-04-23 MED ORDER — EPHEDRINE 5 MG/ML INJ: 10.0000 mg | INTRAVENOUS | Status: DC | PRN

## 2023-04-23 MED ORDER — LACTATED RINGERS IV SOLN: INTRAVENOUS | Status: DC

## 2023-04-23 MED ORDER — OXYCODONE-ACETAMINOPHEN 5-325 MG PO TABS: 2.0000 | ORAL_TABLET | ORAL | Status: DC | PRN

## 2023-04-23 MED ORDER — SOD CITRATE-CITRIC ACID 500-334 MG/5ML PO SOLN: 30.0000 mL | ORAL | Status: DC | PRN

## 2023-04-23 MED ORDER — ONDANSETRON HCL 4 MG/2ML IJ SOLN: 4.0000 mg | INTRAMUSCULAR | Status: DC | PRN

## 2023-04-23 MED ORDER — OXYTOCIN-SODIUM CHLORIDE 30-0.9 UT/500ML-% IV SOLN
2.5000 [IU]/h | INTRAVENOUS | Status: DC
Start: 2023-04-23 — End: 2023-04-23
  Filled 2023-04-23: qty 500

## 2023-04-23 MED ORDER — LACTATED RINGERS IV SOLN: 500.0000 mL | INTRAVENOUS | Status: DC | PRN

## 2023-04-23 MED ORDER — SENNOSIDES-DOCUSATE SODIUM 8.6-50 MG PO TABS
2.0000 | ORAL_TABLET | Freq: Every day | ORAL | Status: DC
  Administered 2023-04-24: 2 via ORAL
  Filled 2023-04-23: qty 2

## 2023-04-23 MED ORDER — LIDOCAINE HCL (PF) 1 % IJ SOLN
INTRAMUSCULAR | Status: DC | PRN
  Administered 2023-04-23: 4 mL via EPIDURAL
  Administered 2023-04-23: 5 mL via EPIDURAL

## 2023-04-23 MED ORDER — PHENYLEPHRINE 80 MCG/ML (10ML) SYRINGE FOR IV PUSH (FOR BLOOD PRESSURE SUPPORT): 80.0000 ug | PREFILLED_SYRINGE | INTRAVENOUS | Status: DC | PRN

## 2023-04-23 MED ORDER — ACETAMINOPHEN 325 MG PO TABS: 650.0000 mg | ORAL_TABLET | ORAL | Status: DC | PRN

## 2023-04-23 MED ORDER — LACTATED RINGERS IV SOLN: 500.0000 mL | Freq: Once | INTRAVENOUS | Status: DC

## 2023-04-23 MED ORDER — ONDANSETRON HCL 4 MG/2ML IJ SOLN: 4.0000 mg | Freq: Four times a day (QID) | INTRAMUSCULAR | Status: DC | PRN

## 2023-04-23 MED ORDER — COCONUT OIL OIL: 1.0000 | TOPICAL_OIL | Status: DC | PRN

## 2023-04-23 MED ORDER — DIPHENHYDRAMINE HCL 50 MG/ML IJ SOLN: 12.5000 mg | INTRAMUSCULAR | Status: DC | PRN

## 2023-04-23 MED ORDER — PRENATAL MULTIVITAMIN CH
1.0000 | ORAL_TABLET | Freq: Every day | ORAL | Status: DC
Start: 2023-04-24 — End: 2023-04-25
  Administered 2023-04-24: 1 via ORAL
  Filled 2023-04-23: qty 1

## 2023-04-23 MED ORDER — METHYLERGONOVINE MALEATE 0.2 MG PO TABS: 0.2000 mg | ORAL_TABLET | ORAL | Status: DC | PRN

## 2023-04-23 MED ORDER — OXYCODONE HCL 5 MG PO TABS
5.0000 mg | ORAL_TABLET | ORAL | Status: DC | PRN
  Administered 2023-04-23 – 2023-04-25 (×3): 5 mg via ORAL
  Filled 2023-04-23 (×3): qty 1

## 2023-04-23 MED ORDER — IBUPROFEN 600 MG PO TABS
600.0000 mg | ORAL_TABLET | Freq: Four times a day (QID) | ORAL | Status: DC
  Administered 2023-04-23 – 2023-04-25 (×7): 600 mg via ORAL
  Filled 2023-04-23 (×7): qty 1

## 2023-04-23 MED ORDER — OXYCODONE-ACETAMINOPHEN 5-325 MG PO TABS: 1.0000 | ORAL_TABLET | ORAL | Status: DC | PRN

## 2023-04-23 MED ORDER — LIDOCAINE HCL (PF) 1 % IJ SOLN: 30.0000 mL | INTRAMUSCULAR | Status: DC | PRN

## 2023-04-23 MED ORDER — DIBUCAINE (PERIANAL) 1 % EX OINT: 1.0000 | TOPICAL_OINTMENT | CUTANEOUS | Status: DC | PRN

## 2023-04-23 MED ORDER — DIPHENHYDRAMINE HCL 25 MG PO CAPS: 25.0000 mg | ORAL_CAPSULE | Freq: Four times a day (QID) | ORAL | Status: DC | PRN

## 2023-04-23 MED ORDER — FENTANYL-BUPIVACAINE-NACL 0.5-0.125-0.9 MG/250ML-% EP SOLN
EPIDURAL | Status: AC
  Filled 2023-04-23: qty 250

## 2023-04-23 MED ORDER — WITCH HAZEL-GLYCERIN EX PADS
1.0000 | MEDICATED_PAD | CUTANEOUS | Status: DC | PRN
  Administered 2023-04-23: 1 via TOPICAL

## 2023-04-23 MED ORDER — ACETAMINOPHEN 325 MG PO TABS
650.0000 mg | ORAL_TABLET | ORAL | Status: DC | PRN
  Administered 2023-04-23 – 2023-04-25 (×3): 650 mg via ORAL
  Filled 2023-04-23 (×3): qty 2

## 2023-04-23 MED ORDER — FENTANYL CITRATE (PF) 100 MCG/2ML IJ SOLN: 50.0000 ug | INTRAMUSCULAR | Status: DC | PRN

## 2023-04-23 MED ORDER — SIMETHICONE 80 MG PO CHEW: 80.0000 mg | CHEWABLE_TABLET | ORAL | Status: DC | PRN

## 2023-04-23 MED ORDER — LACTATED RINGERS IV SOLN
500.0000 mL | Freq: Once | INTRAVENOUS | Status: DC
Start: 2023-04-23 — End: 2023-04-23

## 2023-04-23 MED ORDER — ZOLPIDEM TARTRATE 5 MG PO TABS: 5.0000 mg | ORAL_TABLET | Freq: Every evening | ORAL | Status: DC | PRN

## 2023-04-23 MED ORDER — METHYLERGONOVINE MALEATE 0.2 MG/ML IJ SOLN: 0.2000 mg | INTRAMUSCULAR | Status: DC | PRN

## 2023-04-23 MED ORDER — BENZOCAINE-MENTHOL 20-0.5 % EX AERO
1.0000 | INHALATION_SPRAY | CUTANEOUS | Status: DC | PRN
  Administered 2023-04-23: 1 via TOPICAL
  Filled 2023-04-23: qty 56

## 2023-04-23 NOTE — Lactation Note (Signed)
This note was copied from a baby's chart. Lactation Consultation Note  Patient Name: Chelsea Ballard [id number] Date: 04/23/2023 Age:37 hours Reason for consult: Initial assessment;Early term 37-38.6wks;Breastfeeding assistance  P3- Chelsea Ballard reports that infant doesn't want to latch long to the breast, so she offered 20 mL of formula immediately after breast. Chelsea Ballard's feeding plan is to offer both breast and formula due to Chelsea Ballard's hx of low supply. It had been 2.5 hrs since infant's last feeding, so LC offered to assist with a latch. Chelsea Ballard in agreement. LC placed infant on the left breast in the football hold. After a few attempts, infant latched a nursed. Chelsea Ballard denied experiencing any pain or pinching. Infant does need stimulation to sustain a latch at this time. Chelsea Ballard was excited that infant was finally nursing well.  LC reviewed feeding infant on cue 8-12x in 24 hrs, not allowing infant to go over 3 hrs without a feeding, CDC milk storage guidelines, LC services handout and always offering breast before formula. LC encouraged Chelsea Ballard to call for further assistance as needed. [facility] sent [facility] referral today.  Maternal Data Has patient been taught Hand Expression?: No Does the patient have breastfeeding experience prior to this delivery?: Yes How long did the patient breastfeed?: 3 weeks with second child  Feeding Mother's Current Feeding Choice: Breast Milk and Formula  LATCH Score Latch: Repeated attempts needed to sustain latch, nipple held in mouth throughout feeding, stimulation needed to elicit sucking reflex.  Audible Swallowing: A few with stimulation  Type of Nipple: Everted at rest and after stimulation  Comfort (Breast/Nipple): Soft / non-tender  Hold (Positioning): Assistance needed to correctly position infant at breast and maintain latch.  LATCH Score: 7   Lactation Tools Discussed/Used Pump Education: Milk Storage  Interventions Interventions: Breast feeding basics reviewed;Assisted with  latch;Breast compression;Adjust position;Support pillows;Position options;Education;LC Services brochure  Discharge Discharge Education: Warning signs for feeding baby Pump: Stork Pump [facility] referral sent today)  Consult Status Consult Status: Follow-up Date: 04/24/23 Follow-up type: In-patient    Dema Severin BS, IBCLC 04/23/2023, 6:15 PM

## 2023-04-23 NOTE — Anesthesia Procedure Notes (Signed)
Epidural Patient location during procedure: OB Start time: 04/23/2023 11:30 AM End time: 04/23/2023 11:32 AM  Staffing Anesthesiologist: Beryle Lathe, MD Performed: anesthesiologist   Preanesthetic Checklist Completed: patient identified, IV checked, risks and benefits discussed, monitors and equipment checked, pre-op evaluation and timeout performed  Epidural Patient position: sitting Prep: DuraPrep Patient monitoring: continuous pulse ox and blood pressure Approach: midline Location: L2-L3 Injection technique: LOR saline  Needle:  Needle type: Tuohy  Needle gauge: 17 G Needle length: 9 cm Needle insertion depth: 6 cm Catheter size: 19 Gauge Catheter at skin depth: 11 cm Test dose: negative and Other (1% lidocaine)  Assessment Events: blood not aspirated and no cerebrospinal fluid  Additional Notes Patient identified. Risks including, but not limited to, bleeding, infection, nerve damage, paralysis, inadequate analgesia, blood pressure changes, nausea, vomiting, allergic reaction, postpartum back pain, itching, and headache were discussed. Patient expressed understanding and wished to proceed. Sterile prep and drape, including hand hygiene, mask, and sterile gloves were used. The patient was positioned and the spine was prepped. The skin was anesthetized with lidocaine. No paraesthesia or other complication noted. The patient did not experience any signs of intravascular injection such as tinnitus or metallic taste in mouth, nor signs of intrathecal spread such as rapid motor block. Please see nursing notes for vital signs. The patient tolerated the procedure well.   Leslye Peer, MDReason for block:procedure for pain

## 2023-04-23 NOTE — H&P (Signed)
Chelsea Ballard is a 38 y.o. female G3P2002 at 37 weeks and 1 day presenting in active labor via EMS. Contractions started at 930 am this morning. +FM no lof no vaginal bleeding. Pregnancy has been uncomplicated.  OB History     Gravida  3   Para  2   Term  1   Preterm  1   AB  0   Living  2      SAB  0   IAB      Ectopic      Multiple  0   Live Births  1          Past medical history: Gestational diabetes with previous pregnancy   Past Surgical History:  Procedure Laterality Date   TONSILLECTOMY     TOOTH EXTRACTION  2017   Family History: family history includes Diabetes in her paternal grandfather and paternal grandmother. Social History:  reports that she has never smoked. She has never used smokeless tobacco. She reports that she does not drink alcohol and does not use drugs.     Maternal Diabetes: No Genetic Screening: Normal Maternal Ultrasounds/Referrals: Normal Fetal Ultrasounds or other Referrals:  None Maternal Substance Abuse:  No Significant Maternal Medications:  None Significant Maternal Lab Results:  Group B Strep negative Number of Prenatal Visits:greater than 3 verified prenatal visits Maternal Vaccinations:TDap Other Comments:  None  Review of Systems  Constitutional: Negative.   HENT: Negative.    Eyes: Negative.   Respiratory: Negative.    Cardiovascular: Negative.   Gastrointestinal: Negative.   Endocrine: Negative.   Genitourinary: Negative.   Musculoskeletal: Negative.   Skin: Negative.   Allergic/Immunologic: Negative.   Neurological: Negative.   Hematological: Negative.   Psychiatric/Behavioral: Negative.     History Dilation: 7.5 Effacement (%): 80, 90 Station: 0 Exam by:: [employer] Blood pressure 109/74, pulse 90, temperature 98.2 F (36.8 C), temperature source Oral, resp. rate 18, height 5\' 1"  (1.549 m), weight 72.6 kg, last menstrual period 08/07/2022. Maternal Exam:  Introitus: Normal vulva.    Physical Exam Vitals reviewed.  Constitutional:      Appearance: Normal appearance.  HENT:     Head: Normocephalic and atraumatic.  Cardiovascular:     Rate and Rhythm: Normal rate and regular rhythm.     Pulses: Normal pulses.  Pulmonary:     Effort: Pulmonary effort is normal.     Breath sounds: Normal breath sounds.  Abdominal:     Tenderness: There is no abdominal tenderness.  Genitourinary:    General: Normal vulva.  Musculoskeletal:        General: No swelling.     Cervical back: Normal range of motion and neck supple.  Skin:    General: Skin is warm and dry.  Neurological:     General: No focal deficit present.     Mental Status: She is alert and oriented to person, place, and time.  Psychiatric:        Mood and Affect: Mood normal.        Behavior: Behavior normal.     Prenatal labs: ABO, Rh: --/--/PENDING (02/19 1120) Apositive  Antibody: PENDING (02/19 1120)Negative  Rubella:   RPR:  Negative   HBsAg:   Negative  HIV:   Negative  GBS:   Negative   Assessment/Plan: 37 weeks and 1 day Active labor  Admit to labor and delivery Fetal well being - category 1  Pain control- epidural.  Anticipate SVD   Gerald Leitz 04/23/2023,  12:08 PM

## 2023-04-23 NOTE — Anesthesia Preprocedure Evaluation (Addendum)
Anesthesia Evaluation  Patient identified by MRN, date of birth, ID band Patient awake    Reviewed: Allergy & Precautions, NPO status , Patient's Chart, lab work & pertinent test results  History of Anesthesia Complications Negative for: history of anesthetic complications  Airway Mallampati: II   Neck ROM: Full    Dental   Pulmonary neg pulmonary ROS   Pulmonary exam normal        Cardiovascular negative cardio ROS Normal cardiovascular exam     Neuro/Psych  Headaches  negative psych ROS   GI/Hepatic Neg liver ROS,GERD  Medicated and Controlled,,  Endo/Other   gDM with prior pregnancy Obesity   Renal/GU negative Renal ROS     Musculoskeletal negative musculoskeletal ROS (+)    Abdominal   Peds  Hematology negative hematology ROS (+)  Plt 255k on 2/9    Anesthesia Other Findings   Reproductive/Obstetrics (+) Pregnancy                             Anesthesia Physical Anesthesia Plan  ASA: 2  Anesthesia Plan: Epidural   Post-op Pain Management: Minimal or no pain anticipated   Induction:   PONV Risk Score and Plan: 2 and Treatment may vary due to age or medical condition  Airway Management Planned: Natural Airway  Additional Equipment: None  Intra-op Plan:   Post-operative Plan:   Informed Consent: I have reviewed the patients History and Physical, chart, labs and discussed the procedure including the risks, benefits and alternatives for the proposed anesthesia with the patient or authorized representative who has indicated his/her understanding and acceptance.       Plan Discussed with: Anesthesiologist  Anesthesia Plan Comments: (Labs reviewed. Platelets acceptable, patient not taking any blood thinning medications. Per RN, FHR tracing reported to be stable enough for sitting procedure. Risks and benefits discussed with patient, including PDPH, backache, epidural  hematoma, failed epidural, blood pressure changes, allergic reaction, and nerve injury. Patient expressed understanding and wished to proceed.)       Anesthesia Quick Evaluation

## 2023-04-23 NOTE — MAU Note (Addendum)
..  Chelsea Ballard is a 38 y.o. at [redacted]w[redacted]d here in MAU reporting: patient arrived via EMS c/o ctx this this morning. States they are now every 1-2 minutes. Denies VB or LOF. +FM. States she feels pressure with contractions. SVE 4.5 earlier this week.   Pain score: 10 Vitals:   04/23/23 1117  BP: (!) 105/58  Pulse: (!) 109  Resp: 20     FHT:140 Lab orders placed from triage:   mau labor

## 2023-04-24 LAB — CBC
HCT: 38.6 % (ref 36.0–46.0)
Hemoglobin: 12.9 g/dL (ref 12.0–15.0)
MCH: 29.8 pg (ref 26.0–34.0)
MCHC: 33.4 g/dL (ref 30.0–36.0)
MCV: 89.1 fL (ref 80.0–100.0)
Platelets: 254 10*3/uL (ref 150–400)
RBC: 4.33 MIL/uL (ref 3.87–5.11)
RDW: 12.9 % (ref 11.5–15.5)
WBC: 11.3 10*3/uL — ABNORMAL HIGH (ref 4.0–10.5)
nRBC: 0 % (ref 0.0–0.2)

## 2023-04-24 MED ORDER — IBUPROFEN 600 MG PO TABS: 600.0000 mg | ORAL_TABLET | Freq: Four times a day (QID) | ORAL | 1 refills | Status: AC | PRN

## 2023-04-24 NOTE — Progress Notes (Signed)
POSTPARTUM PROGRESS NOTE  Post Partum Day 1  Subjective:  Chelsea Ballard is a 38 y.o. [id number] s/p VD at [redacted]w[redacted]d.  She reports she is doing well. No acute events overnight. She denies any problems with ambulating, voiding or po intake. Denies nausea or vomiting.  Pain is moderately controlled.  Lochia is appropriate.  Objective: Blood pressure 106/73, pulse 73, temperature (!) 97.5 F (36.4 C), temperature source Oral, resp. rate 17, height 5\' 1"  (1.549 m), weight 72.6 kg, last menstrual period 08/07/2022, SpO2 99%, unknown if currently breastfeeding.  Physical Exam:  General: alert, cooperative and no distress Chest: no respiratory distress Heart:regular rate, distal pulses intact Abdomen: soft, nontender,  Uterine Fundus: firm, appropriately tender DVT Evaluation: No calf swelling or tenderness Extremities: LE edema improving Skin: warm, dry  Recent Labs    04/23/23 1120 04/24/23 0451  HGB 14.8 12.9  HCT 44.0 38.6    Assessment/Plan: BETSY ROSELLO is a 38 y.o. [id number] s/p VD at [redacted]w[redacted]d   PPD#1 - Doing well  Routine postpartum care Dispo: Plan for discharge 2/21.   LOS: 1 day   Lavonda Jumbo, DO, ABFM [facility] 04/24/2023, 10:43 AM

## 2023-04-24 NOTE — Plan of Care (Signed)
Problem: Education: Goal: Knowledge of General Education information will improve Description: Including pain rating scale, medication(s)/side effects and non-pharmacologic comfort measures 04/24/2023 1520 by Donne Hazel, LPN Outcome: Adequate for Discharge 04/24/2023 6045 by Donne Hazel, LPN Outcome: Progressing   Problem: Health Behavior/Discharge Planning: Goal: Ability to manage health-related needs will improve 04/24/2023 1520 by Donne Hazel, LPN Outcome: Adequate for Discharge 04/24/2023 4098 by Donne Hazel, LPN Outcome: Progressing   Problem: Clinical Measurements: Goal: Ability to maintain clinical measurements within normal limits will improve 04/24/2023 1520 by Donne Hazel, LPN Outcome: Adequate for Discharge 04/24/2023 1191 by Donne Hazel, LPN Outcome: Progressing Goal: Will remain free from infection 04/24/2023 1520 by Donne Hazel, LPN Outcome: Adequate for Discharge 04/24/2023 4782 by Donne Hazel, LPN Outcome: Progressing Goal: Diagnostic test results will improve 04/24/2023 1520 by Donne Hazel, LPN Outcome: Adequate for Discharge 04/24/2023 9562 by Donne Hazel, LPN Outcome: Progressing Goal: Respiratory complications will improve 04/24/2023 1520 by Donne Hazel, LPN Outcome: Adequate for Discharge 04/24/2023 1308 by Donne Hazel, LPN Outcome: Progressing Goal: Cardiovascular complication will be avoided 04/24/2023 1520 by Donne Hazel, LPN Outcome: Adequate for Discharge 04/24/2023 6578 by Donne Hazel, LPN Outcome: Progressing   Problem: Activity: Goal: Risk for activity intolerance will decrease 04/24/2023 1520 by Donne Hazel, LPN Outcome: Adequate for Discharge 04/24/2023 4696 by Donne Hazel, LPN Outcome: Progressing   Problem: Nutrition: Goal: Adequate nutrition will be maintained 04/24/2023 1520 by Donne Hazel, LPN Outcome: Adequate for Discharge 04/24/2023 2952 by Donne Hazel, LPN Outcome: Progressing    Problem: Coping: Goal: Level of anxiety will decrease 04/24/2023 1520 by Donne Hazel, LPN Outcome: Adequate for Discharge 04/24/2023 8413 by Donne Hazel, LPN Outcome: Progressing   Problem: Elimination: Goal: Will not experience complications related to bowel motility 04/24/2023 1520 by Donne Hazel, LPN Outcome: Adequate for Discharge 04/24/2023 2440 by Donne Hazel, LPN Outcome: Progressing Goal: Will not experience complications related to urinary retention 04/24/2023 1520 by Donne Hazel, LPN Outcome: Adequate for Discharge 04/24/2023 1027 by Donne Hazel, LPN Outcome: Progressing   Problem: Pain Managment: Goal: General experience of comfort will improve and/or be controlled 04/24/2023 1520 by Donne Hazel, LPN Outcome: Adequate for Discharge 04/24/2023 2536 by Donne Hazel, LPN Outcome: Progressing   Problem: Safety: Goal: Ability to remain free from injury will improve 04/24/2023 1520 by Donne Hazel, LPN Outcome: Adequate for Discharge 04/24/2023 6440 by Donne Hazel, LPN Outcome: Progressing   Problem: Skin Integrity: Goal: Risk for impaired skin integrity will decrease 04/24/2023 1520 by Donne Hazel, LPN Outcome: Adequate for Discharge 04/24/2023 3474 by Donne Hazel, LPN Outcome: Progressing   Problem: Education: Goal: Knowledge of Childbirth will improve 04/24/2023 1520 by Donne Hazel, LPN Outcome: Adequate for Discharge 04/24/2023 2595 by Donne Hazel, LPN Outcome: Progressing Goal: Ability to make informed decisions regarding treatment and plan of care will improve 04/24/2023 1520 by Donne Hazel, LPN Outcome: Adequate for Discharge 04/24/2023 6387 by Donne Hazel, LPN Outcome: Progressing Goal: Ability to state and carry out methods to decrease the pain will improve 04/24/2023 1520 by Donne Hazel, LPN Outcome: Adequate for Discharge 04/24/2023 5643 by Donne Hazel, LPN Outcome: Progressing Goal: Individualized Educational  Video(s) 04/24/2023 1520 by Donne Hazel, LPN Outcome: Adequate for Discharge 04/24/2023 3295 by Donne Hazel, LPN Outcome: Progressing   Problem: Coping: Goal: Ability to verbalize  concerns and feelings about labor and delivery will improve 04/24/2023 1520 by Donne Hazel, LPN Outcome: Adequate for Discharge 04/24/2023 1610 by Donne Hazel, LPN Outcome: Progressing   Problem: Life Cycle: Goal: Ability to make normal progression through stages of labor will improve 04/24/2023 1520 by Donne Hazel, LPN Outcome: Adequate for Discharge 04/24/2023 9604 by Donne Hazel, LPN Outcome: Progressing Goal: Ability to effectively push during vaginal delivery will improve 04/24/2023 1520 by Donne Hazel, LPN Outcome: Adequate for Discharge 04/24/2023 5409 by Donne Hazel, LPN Outcome: Progressing   Problem: Role Relationship: Goal: Will demonstrate positive interactions with the child 04/24/2023 1520 by Donne Hazel, LPN Outcome: Adequate for Discharge 04/24/2023 8119 by Donne Hazel, LPN Outcome: Progressing   Problem: Safety: Goal: Risk of complications during labor and delivery will decrease 04/24/2023 1520 by Donne Hazel, LPN Outcome: Adequate for Discharge 04/24/2023 1478 by Donne Hazel, LPN Outcome: Progressing   Problem: Pain Management: Goal: Relief or control of pain from uterine contractions will improve 04/24/2023 1520 by Donne Hazel, LPN Outcome: Adequate for Discharge 04/24/2023 2956 by Donne Hazel, LPN Outcome: Progressing   Problem: Education: Goal: Knowledge of condition will improve 04/24/2023 1520 by Donne Hazel, LPN Outcome: Adequate for Discharge 04/24/2023 2130 by Donne Hazel, LPN Outcome: Progressing   Problem: Activity: Goal: Will verbalize the importance of balancing activity with adequate rest periods 04/24/2023 1520 by Donne Hazel, LPN Outcome: Adequate for Discharge 04/24/2023 8657 by Donne Hazel, LPN Outcome:  Progressing Goal: Ability to tolerate increased activity will improve 04/24/2023 1520 by Donne Hazel, LPN Outcome: Adequate for Discharge 04/24/2023 8469 by Donne Hazel, LPN Outcome: Progressing   Problem: Coping: Goal: Ability to identify and utilize available resources and services will improve 04/24/2023 1520 by Donne Hazel, LPN Outcome: Adequate for Discharge 04/24/2023 6295 by Donne Hazel, LPN Outcome: Progressing   Problem: Life Cycle: Goal: Chance of risk for complications during the postpartum period will decrease 04/24/2023 1520 by Donne Hazel, LPN Outcome: Adequate for Discharge 04/24/2023 2841 by Donne Hazel, LPN Outcome: Progressing   Problem: Role Relationship: Goal: Ability to demonstrate positive interaction with newborn will improve 04/24/2023 1520 by Donne Hazel, LPN Outcome: Adequate for Discharge 04/24/2023 3244 by Donne Hazel, LPN Outcome: Progressing   Problem: Skin Integrity: Goal: Demonstration of wound healing without infection will improve 04/24/2023 1520 by Donne Hazel, LPN Outcome: Adequate for Discharge 04/24/2023 0102 by Donne Hazel, LPN Outcome: Progressing

## 2023-04-24 NOTE — Plan of Care (Signed)
  Problem: Education: Goal: Knowledge of General Education information will improve Description: Including pain rating scale, medication(s)/side effects and non-pharmacologic comfort measures Outcome: Progressing   Problem: Health Behavior/Discharge Planning: Goal: Ability to manage health-related needs will improve Outcome: Progressing   Problem: Clinical Measurements: Goal: Ability to maintain clinical measurements within normal limits will improve Outcome: Progressing Goal: Will remain free from infection Outcome: Progressing Goal: Diagnostic test results will improve Outcome: Progressing Goal: Respiratory complications will improve Outcome: Progressing Goal: Cardiovascular complication will be avoided Outcome: Progressing   Problem: Activity: Goal: Risk for activity intolerance will decrease Outcome: Progressing   Problem: Nutrition: Goal: Adequate nutrition will be maintained Outcome: Progressing   Problem: Coping: Goal: Level of anxiety will decrease Outcome: Progressing   Problem: Elimination: Goal: Will not experience complications related to bowel motility Outcome: Progressing Goal: Will not experience complications related to urinary retention Outcome: Progressing   Problem: Pain Managment: Goal: General experience of comfort will improve and/or be controlled Outcome: Progressing   Problem: Safety: Goal: Ability to remain free from injury will improve Outcome: Progressing   Problem: Skin Integrity: Goal: Risk for impaired skin integrity will decrease Outcome: Progressing   Problem: Education: Goal: Knowledge of Childbirth will improve Outcome: Progressing Goal: Ability to make informed decisions regarding treatment and plan of care will improve Outcome: Progressing Goal: Ability to state and carry out methods to decrease the pain will improve Outcome: Progressing Goal: Individualized Educational Video(s) Outcome: Progressing   Problem:  Coping: Goal: Ability to verbalize concerns and feelings about labor and delivery will improve Outcome: Progressing   Problem: Life Cycle: Goal: Ability to make normal progression through stages of labor will improve Outcome: Progressing Goal: Ability to effectively push during vaginal delivery will improve Outcome: Progressing   Problem: Role Relationship: Goal: Will demonstrate positive interactions with the child Outcome: Progressing   Problem: Safety: Goal: Risk of complications during labor and delivery will decrease Outcome: Progressing   Problem: Pain Management: Goal: Relief or control of pain from uterine contractions will improve Outcome: Progressing   Problem: Education: Goal: Knowledge of condition will improve Outcome: Progressing   Problem: Activity: Goal: Will verbalize the importance of balancing activity with adequate rest periods Outcome: Progressing Goal: Ability to tolerate increased activity will improve Outcome: Progressing   Problem: Coping: Goal: Ability to identify and utilize available resources and services will improve Outcome: Progressing   Problem: Life Cycle: Goal: Chance of risk for complications during the postpartum period will decrease Outcome: Progressing   Problem: Role Relationship: Goal: Ability to demonstrate positive interaction with newborn will improve Outcome: Progressing   Problem: Skin Integrity: Goal: Demonstration of wound healing without infection will improve Outcome: Progressing

## 2023-04-24 NOTE — Lactation Note (Signed)
This note was copied from a baby's chart. Lactation Consultation Note  Patient Name: Chelsea Ballard ZOXWR'U Date: 04/24/2023 Age:37 hours   P2, 37 wks, @ 24 hrs of life. Mom request for latching assist. Demonstrated positioning/ holds. Encouraged starting with hand expression, breast compression until baby really working at breast. Discussed letting baby smell, getting a big mouth, and bringing baby up onto breast. Infant responds beautifully- feeding well. Demonstrated breaking latch and re-latching. Baby works 15 minutes with LC stimulating baby to keep her awake. 15 minutes of active breast work, couplet still working with [employer] exit. Mom desires for LC to help again next feed if possible, discussed with on coming LC.     Feeding Mother's Current Feeding Choice: Breast Milk and Formula  LATCH Score Latch: Grasps breast easily, tongue down, lips flanged, rhythmical sucking.  Audible Swallowing: Spontaneous and intermittent  Type of Nipple: Everted at rest and after stimulation  Comfort (Breast/Nipple): Soft / non-tender  Hold (Positioning): Assistance needed to correctly position infant at breast and maintain latch.  LATCH Score: 9   Lactation Tools Discussed/Used    Interventions Interventions: Breast feeding basics reviewed;Assisted with latch;Hand express;Breast compression;Education  Discharge Pump: Personal;Stork Pump (Spectra)  Consult Status Consult Status: Follow-up Date: 04/24/23 Follow-up type: In-patient    [facility] 04/24/2023, 3:21 PM

## 2023-04-24 NOTE — Anesthesia Postprocedure Evaluation (Signed)
Anesthesia Post Note  Patient: Chelsea Ballard  Procedure(s) Performed: AN AD HOC LABOR EPIDURAL     Patient location during evaluation: Mother Baby Anesthesia Type: Epidural Level of consciousness: awake and alert and oriented Pain management: satisfactory to patient Vital Signs Assessment: post-procedure vital signs reviewed and stable Respiratory status: respiratory function stable Cardiovascular status: stable Postop Assessment: no headache, no backache, epidural receding, patient able to bend at knees, no signs of nausea or vomiting, adequate PO intake and able to ambulate Anesthetic complications: no   No notable events documented.  Last Vitals:  Vitals:   04/24/23 0112 04/24/23 0442  BP: 122/76 106/73  Pulse: 73 73  Resp: 16 17  Temp: 36.5 C (!) 36.4 C  SpO2: 96% 99%    Last Pain:  Vitals:   04/24/23 1043  TempSrc:   PainSc: 1    Pain Goal:                   [name]

## 2023-04-24 NOTE — Discharge Summary (Signed)
Postpartum Discharge Summary     Patient Name: Chelsea Ballard DOB: 23-Aug-1985 MRN: [id number]  Date of admission: 04/23/2023 Delivery date:04/23/2023 Delivering provider: Gerald Leitz Date of discharge: 04/25/2023  Admitting diagnosis: Normal labor [O80, Z37.9] Intrauterine pregnancy: [redacted]w[redacted]d     Secondary diagnosis:  Principal Problem:   Normal labor AMA  Additional problems: n/a    Discharge diagnosis: Term Pregnancy Delivered                                              Post partum procedures: n/a Augmentation: AROM Complications: None  Hospital course: Onset of Labor With Vaginal Delivery      38 y.o. yo [id number] at [redacted]w[redacted]d was admitted in Active Labor on 04/23/2023. Labor course was uncomplicated. Membrane Rupture Time/Date: 12:23 PM,04/23/2023  Delivery Method:Vaginal, Spontaneous Operative Delivery:N/A Episiotomy: None Lacerations:  None Patient had a postpartum course that was uncomplicated.  She is ambulating, tolerating a regular diet, passing flatus, and urinating well. Patient is discharged home in stable condition on 04/25/23.  Newborn Data: Birth date:04/23/2023 Birth time:2:23 PM Gender:Female Living status:Living Apgars:7 ,9  Weight:2780 g  Magnesium Sulfate received: No BMZ received: No Rhophylac:No MMR:No T-DaP:Given prenatally Flu: No RSV Vaccine received: No Transfusion:No Immunizations administered: Immunization History  Administered Date(s) Administered   Influenza Whole 03/20/2010   Moderna Sars-Covid-2 Vaccination 01/19/2020, 02/17/2020    Physical exam  Vitals:   04/24/23 0442 04/24/23 1445 04/24/23 2111 04/25/23 0545  BP: 106/73 101/70 121/83 114/77  Pulse: 73 80 82 83  Resp: 17 18 18 16   Temp: (!) 97.5 F (36.4 C) 97.8 F (36.6 C) 97.7 F (36.5 C) 97.9 F (36.6 C)  TempSrc: Oral Oral Axillary Oral  SpO2: 99%  100% 100%  Weight:      Height:       General: alert, cooperative, and no distress Lochia: appropriate Uterine Fundus:  firm Incision: N/A DVT Evaluation: No evidence of DVT seen on physical exam. Negative Homan's sign. No cords or calf tenderness. Calf/Ankle edema is present Labs: Lab Results  Component Value Date   WBC 11.3 (H) 04/24/2023   HGB 12.9 04/24/2023   HCT 38.6 04/24/2023   MCV 89.1 04/24/2023   PLT 254 04/24/2023      Latest Ref Rng & Units 04/13/2023   10:59 PM  CMP  Glucose 70 - 99 mg/dL 595   BUN 6 - 20 mg/dL <5   Creatinine 6.38 - 1.00 mg/dL 7.56   Sodium 433 - 295 mmol/L 133   Potassium 3.5 - 5.1 mmol/L 3.4   Chloride 98 - 111 mmol/L 105   CO2 22 - 32 mmol/L 19   Calcium 8.9 - 10.3 mg/dL 8.5   Total Protein 6.5 - 8.1 g/dL 7.0   Total Bilirubin 0.0 - 1.2 mg/dL 0.7   Alkaline Phos 38 - 126 U/L 271   AST 15 - 41 U/L 45   ALT 0 - 44 U/L 33    Edinburgh Score:    04/24/2023    9:00 AM  Edinburgh Postnatal Depression Scale Screening Tool  I have been able to laugh and see the funny side of things. 0  I have looked forward with enjoyment to things. 0  I have blamed myself unnecessarily when things went wrong. 0  I have been anxious or worried for no good reason. 0  I have felt  scared or panicky for no good reason. 1  Things have been getting on top of me. 0  I have been so unhappy that I have had difficulty sleeping. 0  I have felt sad or miserable. 0  I have been so unhappy that I have been crying. 0  The thought of harming myself has occurred to me. 0  Edinburgh Postnatal Depression Scale Total 1      After visit meds:  Allergies as of 04/25/2023       Reactions   Peanut-containing Drug Products Rash        Medication List     TAKE these medications    acetaminophen 325 MG tablet Commonly known as: Tylenol Take 2 tablets (650 mg total) by mouth every 6 (six) hours as needed for mild pain or moderate pain (for pain scale < 4).   cetirizine 10 MG tablet Commonly known as: ZyrTEC Allergy Take 1 tablet (10 mg total) by mouth daily.   CitraNatal Rx 27-1 MG  Tabs Take 1 tablet by mouth daily.   cyclobenzaprine 5 MG tablet Commonly known as: FLEXERIL Take 1 tablet (5 mg total) by mouth every 6 (six) hours as needed for muscle spasms.   ibuprofen 600 MG tablet Commonly known as: ADVIL Take 1 tablet (600 mg total) by mouth every 6 (six) hours as needed.   magnesium 30 MG tablet Take 30 mg by mouth 2 (two) times daily.   omeprazole 20 MG capsule Commonly known as: PRILOSEC Take 20 mg by mouth 2 (two) times daily.   ondansetron 4 MG disintegrating tablet Commonly known as: ZOFRAN-ODT Take 1 tablet (4 mg total) by mouth every 8 (eight) hours as needed for nausea or vomiting.         Discharge home in stable condition Infant Feeding: Breast Infant Disposition:home with mother Discharge instruction: per After Visit Summary and Postpartum booklet. Activity: Advance as tolerated. Pelvic rest for 6 weeks.  Diet: routine diet Anticipated Birth Control:  unknown Postpartum Appointment:6 weeks Additional Postpartum F/U:  n/a Future Appointments:No future appointments. Follow up Visit:  Follow-up Information     Gerald Leitz, MD. Schedule an appointment as soon as possible for a visit in 6 week(s).   Specialty: Obstetrics and Gynecology Why: Please schedule a postpartum appointment in 6 weeks Contact information: [address] [place] [address] [phone]                     04/25/2023 Steva Ready, DO

## 2023-04-24 NOTE — Lactation Note (Signed)
This note was copied from a baby's chart. Lactation Consultation Note  Patient Name: Girl Latitia Housewright ZOXWR'U Date: 04/24/2023 Age:36 hours Reason for consult: Mother's request;Follow-up assessment;Early term 37-38.6wks;Breastfeeding assistance  P3- MOB requested latching assistance. Infant was alert and rooting when LC came into room. MOB reports that infant is nursing much better today than yesterday. MOB also reports that she has hope that this infant will continue nursing well, unlike her older children. LC assisted MOB with placing infant on the right breast in the football hold. Infant latched immediately and had a strong rhythmic suck. MOB denied experiencing any pinching or pain. Infant nursed continuously for 15 minutes. MOB's sister then offered formula per MOB's request. LC encouraged MOB to call for further assistance as needed.  Maternal Data Has patient been taught Hand Expression?: Yes Does the patient have breastfeeding experience prior to this delivery?: Yes  Feeding Mother's Current Feeding Choice: Breast Milk and Formula  LATCH Score Latch: Grasps breast easily, tongue down, lips flanged, rhythmical sucking.  Audible Swallowing: A few with stimulation  Type of Nipple: Everted at rest and after stimulation  Comfort (Breast/Nipple): Soft / non-tender  Hold (Positioning): Full assist, staff holds infant at breast  LATCH Score: 7   Lactation Tools Discussed/Used Pump Education: Milk Storage  Interventions Interventions: Breast feeding basics reviewed;Assisted with latch;Breast compression;Adjust position;Support pillows;Position options;Education;LC Services brochure  Discharge Discharge Education: Warning signs for feeding baby Pump: Stork Pump  Consult Status Consult Status: Follow-up Date: 04/25/23 Follow-up type: In-patient    Dema Severin BS, IBCLC 04/24/2023, 10:08 PM

## 2023-04-24 NOTE — Lactation Note (Signed)
This note was copied from a baby's chart. Lactation Consultation Note  Patient Name: Girl Fransheska Willingham [id number] Date: 04/24/2023 Age:37 hours Reason for consult: Follow-up assessment  P2, 37 wks, @ 20 hrs of life.  Baby spitting up clear fluid and white chunky bits of fresh formula. Discussed belly size on first day (5-10 ml), not to give to much formula. By choice mom both breast/bottle. Discussed Day 1 sleepy, Day 2 more ready for life, cluster feeding overnights to bring in milk supply. Feeding every 2-3 hrs if breast milk, every 3-4 okay if formula feeding. Encouraged baby working @ breast helps with production/ milk coming in. Mom has RECEIVED her stork pump. Encouraged mom to call when baby ready to eat, to work with [facility], otherwise mom receptive to [facility] returning @ 1200.  Feeding Mother's Current Feeding Choice: Breast Milk and Formula   Interventions Interventions: Breast feeding basics reviewed;Education  Discharge Pump: Stork Pump;Personal [employer] Pump RECIEVED)  Consult Status Consult Status: Follow-up Date: 04/24/23 Follow-up type: In-patient    [facility] 04/24/2023, 10:33 AM

## 2023-04-24 NOTE — Lactation Note (Signed)
This note was copied from a baby's chart. Lactation Consultation Note  Patient Name: Chelsea Ballard ZOXWR'U Date: 04/24/2023 Age:37 hours Reason for consult: Follow-up assessment  P2, 37 wks, @ 21 hrs of life. LC to room @ agreed upon time. Infant asleep in bassinet- per mom baby was fussy- ate formula recently- bottle on the bedside table. Highlighted for mom- baby will not need to eat again for 3 hrs from last formula feeding. Encouraged mom to call for assist as desired.    Feeding Mother's Current Feeding Choice: Breast Milk and Formula   Interventions Interventions: Breast feeding basics reviewed;Education  Discharge Pump: Stork Pump;Personal [employer] Pump RECIEVED)  Consult Status Consult Status: Follow-up Date: 04/24/23 Follow-up type: In-patient    [facility] 04/24/2023, 12:03 PM

## 2023-04-25 NOTE — Lactation Note (Signed)
This note was copied from a baby's chart. Lactation Consultation Note  Patient Name: Chelsea Ballard ZOXWR'U Date: 04/25/2023 Age:37 hours Reason for consult: Follow-up assessment;Maternal endocrine disorder;Early term 37-38.6wks  P2, 37 wks,  @ 44 hrs of life. Mom requested breast feeding assistance- with LC arrival mom has infant latched on left breast in cross-cradle position. Praised mom, offered tips on flanging lips if latch uncomfortable. Infant feeding well through [facility] visit. Discussed pump brand- encouraged mom in Spectra being a solid pump choice. Questioned mom on nipple soreness, slight soreness. Encouraged mom to keep working on big mouth latch with baby and use EBM or coconut oil after each feed. Discussed cluster feeding overnight/ early morning brings in our milk supply, shared expectations of milk coming in. Highlighted risk of engorgement. Discussed hand pump/express to soften breasts, motrin as anti-inflammatory, and ice packs for 10-20 minutes post feed/pumping if still over-full is the best treatments for inflamed/engorged breasts.  Maternal Data    Feeding Mother's Current Feeding Choice: Breast Milk and Formula  LATCH Score Latch: Grasps breast easily, tongue down, lips flanged, rhythmical sucking.  Audible Swallowing: Spontaneous and intermittent  Type of Nipple: Everted at rest and after stimulation  Comfort (Breast/Nipple): Soft / non-tender  Hold (Positioning): Assistance needed to correctly position infant at breast and maintain latch.  LATCH Score: 9   Lactation Tools Discussed/Used    Interventions Interventions: Breast feeding basics reviewed;Expressed milk;Education  Discharge Pump: DEBP;Personal (Spectra pump @ bedsidefor mom to take home)  Consult Status Consult Status: Complete Date: 04/25/23    [facility] 04/25/2023, 11:22 AM

## 2023-05-02 ENCOUNTER — Telehealth (HOSPITAL_COMMUNITY): Payer: Self-pay

## 2023-05-02 NOTE — Telephone Encounter (Signed)
 05/02/2023 1852  Name: KAMBRI DISMORE MRN: [id number] DOB: 09-Jun-1985  Reason for Call:  Transition of Care Hospital Discharge Call  Contact Status: Patient Contact Status: Message  Language assistant needed:          Follow-Up Questions:    Inocente Salles Postnatal Depression Scale:  In the Past 7 Days:    PHQ2-9 Depression Scale:     Discharge Follow-up:    Post-discharge interventions: NA  Signature  Signe Colt

## 2023-05-30 ENCOUNTER — Ambulatory Visit
Admission: EM | Admit: 2023-05-30 | Discharge: 2023-05-30 | Disposition: A | Discharge status: Home / Self Care | Attending: Emergency Medicine | Admitting: Emergency Medicine

## 2023-05-30 DIAGNOSIS — J029 Acute pharyngitis, unspecified: Secondary | ICD-10-CM

## 2023-05-30 LAB — POC COVID19/FLU A&B COMBO
Covid Antigen, POC: NEGATIVE
Influenza A Antigen, POC: NEGATIVE
Influenza B Antigen, POC: NEGATIVE

## 2023-05-30 LAB — POCT RAPID STREP A (OFFICE): Rapid Strep A Screen: NEGATIVE

## 2023-05-30 NOTE — Discharge Instructions (Signed)
 You were seen in the urgent care today for concerns of fever and bodyaches. You tested negative for COVID, influenza, and strep. I suspect you likely have a viral pharyngitis at this time causing some of your throat discomfort. As steroid can cause side effects in breastfed infants, we typically do not recommend this at this stage. Please use Tylenol and ibuprofen as needed for pain. You can try using a topical numbing spray likely Chloraseptic spray for sore throat. For concerns for any new or worsening symptoms, return to the urgent care or follow up with your primary care provider.

## 2023-05-30 NOTE — ED Triage Notes (Signed)
 Patient here today with c/o fever, body aches, and chills since yesterday morning and ST this morning. She has been taking Tylenol with some relief of the fever. Patient gave birth on 04/23/2023 and is currently breastfeeding.

## 2023-05-30 NOTE — ED Provider Notes (Signed)
 [facility]    CSN: [id number] Arrival date & time: 05/30/23  1158      History   Chief Complaint Chief Complaint  Patient presents with   Fever    HPI Chelsea Ballard is a 38 y.o. female.  Patient with recent history of labor and vaginal delivery presents emergency department concerns of a fever.  Patient endorsing subjective fever, body aches, chills, and sore throat starting this morning.  States she has been taking Tylenol with some relief in symptoms.  She delivered vaginally on 04/23/2023 and reports currently breast-feeding.  Endorses some breast discomfort but denies any notable swelling or induration of the breast.  She at this time denies any abdominal pain or discomfort, or pelvic pain or vaginal discharge or drainage.   Fever   Past Medical History:  Diagnosis Date   Gestational diabetes    Migraine     Patient Active Problem List   Diagnosis Date Noted   Normal labor 04/23/2023   Fatigue 12/15/2020   Hypoglycemia 12/17/2019   Preterm premature rupture of membranes (PPROM) delivered, current hospitalization 09/27/2019   Abnormal glucose tolerance test (GTT) during pregnancy, antepartum 09/01/2019   Hemorrhoids 05/15/2010   Constipation 05/15/2010   Helicobacter pylori infection 03/26/2010   ACID REFLUX DISEASE 03/23/2010   FOLLICULITIS 03/23/2010   ACUTE PHARYNGITIS 04/06/2007   CONTACT DERMATITIS 04/06/2007    Past Surgical History:  Procedure Laterality Date   TONSILLECTOMY     TOOTH EXTRACTION  2017    OB History     Gravida  3   Para  3   Term  2   Preterm  1   AB  0   Living  3      SAB  0   IAB      Ectopic      Multiple  0   Live Births  2            Home Medications    Prior to Admission medications   Medication Sig Start Date End Date Taking? Authorizing Provider  acetaminophen (TYLENOL) 325 MG tablet Take 2 tablets (650 mg total) by mouth every 6 (six) hours as needed for mild pain or moderate pain  (for pain scale < 4). 09/29/19   Myna Hidalgo, DO  cyclobenzaprine (FLEXERIL) 5 MG tablet Take 1 tablet (5 mg total) by mouth every 6 (six) hours as needed for muscle spasms. Patient not taking: Reported on 05/30/2023 04/10/23   Aviva Signs, CNM  ibuprofen (ADVIL) 600 MG tablet Take 1 tablet (600 mg total) by mouth every 6 (six) hours as needed. 04/24/23   Gerald Leitz, MD  omeprazole (PRILOSEC) 20 MG capsule Take 20 mg by mouth 2 (two) times daily.    [provider]  Prenat w/o A-FeCb-FeGl-DSS-FA (CITRANATAL RX) 27-1 MG TABS Take 1 tablet by mouth daily. 07/12/19   [provider]    Family History Family History  Problem Relation Age of Onset   Diabetes Paternal Grandmother    Diabetes Paternal Grandfather     Social History Social History   Tobacco Use   Smoking status: Never   Smokeless tobacco: Never  Vaping Use   Vaping status: Never Used  Substance Use Topics   Alcohol use: No   Drug use: No     Allergies   Peanut-containing drug products   Review of Systems Review of Systems  Constitutional:  Positive for fever.  All other systems reviewed and are negative.  Physical Exam Triage Vital Signs ED Triage Vitals  Encounter Vitals Group     BP 05/30/23 1220 119/76     Systolic BP Percentile --      Diastolic BP Percentile --      Pulse Rate 05/30/23 1220 91     Resp 05/30/23 1220 16     Temp 05/30/23 1220 98.6 F (37 C)     Temp Source 05/30/23 1220 Oral     SpO2 05/30/23 1220 97 %     Weight 05/30/23 1222 151 lb (68.5 kg)     Height 05/30/23 1222 5' (1.524 m)     Head Circumference --      Peak Flow --      Pain Score 05/30/23 1221 8     Pain Loc --      Pain Education --      Exclude from Growth Chart --    No data found.  Updated Vital Signs BP 119/76 (BP Location: Right Arm)   Pulse 91   Temp 98.6 F (37 C) (Oral)   Resp 16   Ht 5' (1.524 m)   Wt 151 lb (68.5 kg)   LMP 08/07/2022 (Approximate)   SpO2 97%    Breastfeeding Yes   BMI 29.49 kg/m   Visual Acuity Right Eye Distance:   Left Eye Distance:   Bilateral Distance:    Right Eye Near:   Left Eye Near:    Bilateral Near:     Physical Exam Vitals and nursing note reviewed. Exam conducted with a chaperone present.  Constitutional:      General: She is not in acute distress.    Appearance: She is well-developed.  HENT:     Head: Normocephalic and atraumatic.  Eyes:     Conjunctiva/sclera: Conjunctivae normal.  Cardiovascular:     Rate and Rhythm: Normal rate and regular rhythm.     Heart sounds: No murmur heard. Pulmonary:     Effort: Pulmonary effort is normal. No respiratory distress.     Breath sounds: Normal breath sounds.  Chest:  Breasts:    Breasts are symmetrical.     Right: Tenderness present. No swelling, inverted nipple, mass, nipple discharge or skin change.     Left: Tenderness present. No swelling, inverted nipple, mass, nipple discharge or skin change.     Comments: Generalized tenderness overlying both breasts likely due to lactation.  No evidence of cellulitis, induration or mass present in the breast. Abdominal:     General: Abdomen is flat. Bowel sounds are normal. There is no distension.     Palpations: Abdomen is soft.     Tenderness: There is no abdominal tenderness. There is no guarding.  Musculoskeletal:        General: No swelling.     Cervical back: Neck supple.  Skin:    General: Skin is warm and dry.     Capillary Refill: Capillary refill takes less than 2 seconds.  Neurological:     Mental Status: She is alert.  Psychiatric:        Mood and Affect: Mood normal.      UC Treatments / Results  Labs (all labs ordered are listed, but only abnormal results are displayed) Labs Reviewed  POC COVID19/FLU A&B COMBO - Normal  POCT RAPID STREP A (OFFICE) - Normal    EKG   Radiology No results found.  Procedures Procedures (including critical care time)  Medications Ordered in  UC Medications - No data to display  Initial Impression / Assessment and Plan / UC Course  I have reviewed the triage vital signs and the nursing notes.  Pertinent labs & imaging results that were available during my care of the patient were reviewed by me and considered in my medical decision making (see chart for details).     Patient presents to the urgent care today with concerns of a fever. Also reporting some sore throat and pain with swallowing or eating but denies dysphagia. States that symptoms have been ongoing since this morning. Patient is about 4-5 weeks post-uncomplicated vaginal delivery. She denies any vaginal complaints or abdominal pain at this time. Denies any recent vaginal bleeding, spotting, or discharge.   Physical exam is reassuring with no focal abdominal tenderness.  There is no evident oropharyngeal erythema and patient's heart and lung sounds are unremarkable.  Patient does have some mild anterior cervical chain lymphadenopathy but no focal neck swelling palpated.  The oropharynx has no evident erythema or exudates present.  Will obtain respiratory antigen swab as well as strep test for further evaluation.  Chaperoned breast exam was performed with no evident abnormalities in breast.  There is no erythema, swelling, induration, or abnormal drainage or discharge from the breast.  No clinical concerns for mastitis at this time.  Patient is negative for strep, COVID, and influenza on point-of-care testing.  I suspect she likely has a viral pharyngitis causing her current symptoms.  Given the patient is currently breast-feeding and there is some clinical concern for steroid side effect on breast-fed infants, discussed possible management with steroid versus alternative options.  Decided against steroid medications at this time.  Encourage patient to continue to follow-up with primary care provider as well as infants pediatrician for any concerns or of any new or worsening  symptoms.  Discussed return precautions.  Patient discharged home in stable condition.   Final Clinical Impressions(s) / UC Diagnoses   Final diagnoses:  Acute pharyngitis, unspecified etiology     Discharge Instructions      You were seen in the urgent care today for concerns of fever and bodyaches. You tested negative for COVID, influenza, and strep. I suspect you likely have a viral pharyngitis at this time causing some of your throat discomfort. As steroid can cause side effects in breastfed infants, we typically do not recommend this at this stage. Please use Tylenol and ibuprofen as needed for pain. You can try using a topical numbing spray likely Chloraseptic spray for sore throat. For concerns for any new or worsening symptoms, return to the urgent care or follow up with your primary care provider.     ED Prescriptions   None    PDMP not reviewed this encounter.   Smitty Knudsen, PA-C 05/30/23 1321

## 2023-07-02 ENCOUNTER — Ambulatory Visit: Admitting: Physical Therapy

## 2023-07-21 NOTE — Therapy (Addendum)
 OUTPATIENT PHYSICAL THERAPY THORACOLUMBAR EVALUATION   Patient Name: Chelsea Ballard MRN: [id number] DOB:September 05, 1985, 38 y.o., female Today's Date: 07/22/2023  END OF SESSION:  PT End of Session - 07/22/23 1112     Visit Number 1    Date for PT Re-Evaluation 09/30/23    Authorization Type [place] Medicaid    PT Start Time 1112    PT Stop Time 1145    PT Time Calculation (min) 33 min             Past Medical History:  Diagnosis Date   Gestational diabetes    Migraine    Past Surgical History:  Procedure Laterality Date   TONSILLECTOMY     TOOTH EXTRACTION  2017   Patient Active Problem List   Diagnosis Date Noted   Normal labor 04/23/2023   Fatigue 12/15/2020   Hypoglycemia 12/17/2019   Preterm premature rupture of membranes (PPROM) delivered, current hospitalization 09/27/2019   Abnormal glucose tolerance test (GTT) during pregnancy, antepartum 09/01/2019   Hemorrhoids 05/15/2010   Constipation 05/15/2010   Helicobacter pylori infection 03/26/2010   ACID REFLUX DISEASE 03/23/2010   FOLLICULITIS 03/23/2010   ACUTE PHARYNGITIS 04/06/2007   CONTACT DERMATITIS 04/06/2007    PCP: Perley Bradley  REFERRING PROVIDER: Arlee Lace   REFERRING DIAG:  [phone] (ICD-10-CM) - Traumatic rupture of symphysis pubis, initial encounter  M54.30 (ICD-10-CM) - Sciatica, unspecified side    Rationale for Evaluation and Treatment: Rehabilitation  THERAPY DIAG:  Muscle weakness (generalized)  Sciatica of right side  Other low back pain  Pelvic floor weakness  ONSET DATE: 04/23/23  SUBJECTIVE:                                                                                                                                                                                           SUBJECTIVE STATEMENT: I still have pain but not like before. I feel a little bit better but every day is different. I feel the pain in my low back and down into my R leg.   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   PAIN:  Are you having pain? Yes: NPRS scale: 7/10  Pain location: across low back and into R leg  Pain description: achy, burning, numbness in R leg Aggravating factors: standing, moving, fast walking  Relieving factors: therapy helped before my pregnancy so I asked them to send me back  PRECAUTIONS: None  RED FLAGS: None   WEIGHT BEARING RESTRICTIONS: No  FALLS:  Has patient fallen in last 6 months? No  LIVING ENVIRONMENT: Lives with: lives with their family Lives in: House/apartment Stairs:  Yes: Internal: 14 steps; on right going up and External: 4 steps; can reach both Has following equipment at home: None  OCCUPATION: CNA  PLOF: Independent  PATIENT GOALS: tried to get rid of pain, have better balance on the R side   NEXT MD VISIT:   OBJECTIVE:  Note: Objective measures were completed at Evaluation unless otherwise noted.  DIAGNOSTIC FINDINGS:  N/A  COGNITION: Overall cognitive status: Within functional limits for tasks assessed     SENSATION: WFL  MUSCLE LENGTH: Hamstrings: mild tightness in BLE   PALPATION: Some pain and TTP along lumbar spine, in R calf, and R hip musculature   LUMBAR ROM:   AROM eval  Flexion Almost touches toes, pain in R calf and a little in low back  Extension Pain at end range  Right lateral flexion Distal thigh  Left lateral flexion Distal thigh  Right rotation WNL  Left rotation WNL pain at end range   (Blank rows = not tested)  LOWER EXTREMITY ROM:  WFL BLE   LOWER EXTREMITY MMT:    MMT Right eval Left eval  Hip flexion 4- 4+  Hip extension    Hip abduction 4- 4+  Hip adduction    Hip internal rotation    Hip external rotation    Knee flexion 4- 4+  Knee extension 4- 4+  Ankle dorsiflexion    Ankle plantarflexion    Ankle inversion    Ankle eversion     (Blank rows = not tested)  LUMBAR SPECIAL TESTS:  Straight leg raise test: Positive, Slump test:  Positive, and FABER test: Negative  FUNCTIONAL TESTS:  5 times sit to stand: 22s with pain in back on R side    TREATMENT DATE: EVAL-07/22/23                                                                                                                                 PATIENT EDUCATION:  Education details: POC and HEP Person educated: Patient Education method: Explanation Education comprehension: verbalized understanding and returned demonstration  HOME EXERCISE PROGRAM: Access Code: [email or web address] URL: [email or web address] Date: 07/22/2023 Prepared by: Donavon Fudge  Exercises - Supine Posterior Pelvic Tilt  - 1 x daily - 7 x weekly - 2 sets - 10 reps - Supine Bridge  - 1 x daily - 7 x weekly - 2 sets - 10 reps - Supine Hip Adduction Isometric with Ball  - 1 x daily - 7 x weekly - 2 sets - 10 reps - Supine Lower Trunk Rotation  - 1 x daily - 7 x weekly - 2 sets - 10 reps - Cat Cow  - 1 x daily - 7 x weekly - 2 sets - 5 reps  ASSESSMENT:  CLINICAL IMPRESSION: Patient is a 38 y.o. female who was seen today for physical therapy evaluation and treatment for low back and hip pain. She has a history of sustained rupture of symphysis pubis  and sciatic pain. Pt was coming for PT prior to delivery for these problems. She gave birth at the end of February. She is wearing a sacral belt that she wears every day and most of the day. She feels it helps with stability. Pt presents with some weakness in her RLE and her core is weak. She will benefit from PT to work on core and pelvic floor strengthening to provide improved stability and mobility to decrease pain levels.   OBJECTIVE IMPAIRMENTS: decreased balance, decreased ROM, decreased strength, impaired flexibility, and pain.   ACTIVITY LIMITATIONS: carrying, lifting, bending, and squatting  PARTICIPATION LIMITATIONS: cleaning, occupation, and yard work  [facility] POTENTIAL: Good  CLINICAL DECISION MAKING:  Stable/uncomplicated  EVALUATION COMPLEXITY: Low  GOALS: Goals reviewed with patient? Yes  SHORT TERM GOALS: Target date: 08/26/23  Patient will be independent with initial HEP.  Baseline:  Goal status: INITIAL  2.  Patient will report centralization of radicular symptoms.  Baseline: pain radiates into RLE  Goal status: INITIAL   LONG TERM GOALS: Target date: 09/30/23  Patient will be independent with advanced/ongoing HEP to improve outcomes and carryover.  Baseline:  Goal status: INITIAL  2.  Patient will report 50-75% improvement in low back pain to improve QOL. (<3/10) Baseline: 7/10 at worst  Goal status: INITIAL  3.  Patient will demonstrate full pain free lumbar ROM to perform ADLs.   Baseline: see chart  Goal status: INITIAL  4.  Patient will demonstrate improved functional strength as demonstrated by 5xSTS <14s . Baseline: 22s Goal status: INITIAL  5.  Patient will demonstrate improved LE strength as demonstrated by 5/5 in all muscle groups . Baseline:  Goal status: INITIAL  PLAN:  PT FREQUENCY: 1-2x/week  PT DURATION: 10 weeks  PLANNED INTERVENTIONS: 97110-Therapeutic exercises, 97530- Therapeutic activity, 97112- Neuromuscular re-education, 97535- Self Care, [id number]- Manual therapy, G0283- Electrical stimulation (unattended), Patient/Family education, Balance training, Stair training, Taping, Dry Needling, Joint mobilization, Spinal mobilization, Cryotherapy, and Moist heat.  PLAN FOR NEXT SESSION: low back and core stability, R hip stretching   **No traction, e-stim, vaso, iontoH. J. Heinz, PT 07/22/2023, 12:18 PM

## 2023-07-22 ENCOUNTER — Ambulatory Visit: Attending: Obstetrics and Gynecology

## 2023-07-22 DIAGNOSIS — M5431 Sciatica, right side: Secondary | ICD-10-CM | POA: Insufficient documentation

## 2023-07-22 DIAGNOSIS — N8189 Other female genital prolapse: Secondary | ICD-10-CM | POA: Insufficient documentation

## 2023-07-22 DIAGNOSIS — M6281 Muscle weakness (generalized): Secondary | ICD-10-CM | POA: Insufficient documentation

## 2023-07-22 DIAGNOSIS — M5459 Other low back pain: Secondary | ICD-10-CM | POA: Diagnosis present

## 2023-07-22 NOTE — Addendum Note (Signed)
 Addended by: Donavon Fudge on: 07/22/2023 12:19 PM   Modules accepted: Orders

## 2023-08-06 ENCOUNTER — Ambulatory Visit

## 2023-08-11 NOTE — Therapy (Signed)
 OUTPATIENT PHYSICAL THERAPY THORACOLUMBAR TREATMENT   Patient Name: Chelsea Ballard MRN: [id number] DOB:1985/10/19, 38 y.o., female Today's Date: 08/11/2023  END OF SESSION:    Past Medical History:  Diagnosis Date   Gestational diabetes    Migraine    Past Surgical History:  Procedure Laterality Date   TONSILLECTOMY     TOOTH EXTRACTION  2017   Patient Active Problem List   Diagnosis Date Noted   Normal labor 04/23/2023   Fatigue 12/15/2020   Hypoglycemia 12/17/2019   Preterm premature rupture of membranes (PPROM) delivered, current hospitalization 09/27/2019   Abnormal glucose tolerance test (GTT) during pregnancy, antepartum 09/01/2019   Hemorrhoids 05/15/2010   Constipation 05/15/2010   Helicobacter pylori infection 03/26/2010   ACID REFLUX DISEASE 03/23/2010   FOLLICULITIS 03/23/2010   ACUTE PHARYNGITIS 04/06/2007   CONTACT DERMATITIS 04/06/2007    PCP: Perley Bradley  REFERRING PROVIDER: Arlee Lace   REFERRING DIAG:  [phone] (ICD-10-CM) - Traumatic rupture of symphysis pubis, initial encounter  M54.30 (ICD-10-CM) - Sciatica, unspecified side    Rationale for Evaluation and Treatment: Rehabilitation  THERAPY DIAG:  No diagnosis found.  ONSET DATE: 04/23/23  SUBJECTIVE:                                                                                                                                                                                           SUBJECTIVE STATEMENT: I still have pain but not like before. I feel a little bit better but every day is different. I feel the pain in my low back and down into my R leg.   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   PAIN:  Are you having pain? Yes: NPRS scale: 7/10  Pain location: across low back and into R leg  Pain description: achy, burning, numbness in R leg Aggravating factors: standing, moving, fast walking  Relieving factors: therapy helped before my pregnancy so I  asked them to send me back  PRECAUTIONS: None  RED FLAGS: None   WEIGHT BEARING RESTRICTIONS: No  FALLS:  Has patient fallen in last 6 months? No  LIVING ENVIRONMENT: Lives with: lives with their family Lives in: House/apartment Stairs: Yes: Internal: 14 steps; on right going up and External: 4 steps; can reach both Has following equipment at home: None  OCCUPATION: CNA  PLOF: Independent  PATIENT GOALS: tried to get rid of pain, have better balance on the R side   NEXT MD VISIT:   OBJECTIVE:  Note: Objective measures were completed at Evaluation unless otherwise noted.  DIAGNOSTIC FINDINGS:  N/A  COGNITION: Overall cognitive status: Within functional limits for  tasks assessed     SENSATION: WFL  MUSCLE LENGTH: Hamstrings: mild tightness in BLE   PALPATION: Some pain and TTP along lumbar spine, in R calf, and R hip musculature   LUMBAR ROM:   AROM eval  Flexion Almost touches toes, pain in R calf and a little in low back  Extension Pain at end range  Right lateral flexion Distal thigh  Left lateral flexion Distal thigh  Right rotation WNL  Left rotation WNL pain at end range   (Blank rows = not tested)  LOWER EXTREMITY ROM:  WFL BLE   LOWER EXTREMITY MMT:    MMT Right eval Left eval  Hip flexion 4- 4+  Hip extension    Hip abduction 4- 4+  Hip adduction    Hip internal rotation    Hip external rotation    Knee flexion 4- 4+  Knee extension 4- 4+  Ankle dorsiflexion    Ankle plantarflexion    Ankle inversion    Ankle eversion     (Blank rows = not tested)  LUMBAR SPECIAL TESTS:  Straight leg raise test: Positive, Slump test: Positive, and FABER test: Negative  FUNCTIONAL TESTS:  5 times sit to stand: 22s with pain in back on R side    TREATMENT DATE:  08/12/23 NuStep Sidelying adduction (top leg on foam roller) holding up top leg ER with bottom leg holding up both legs bottom leg doing IR  PPT Bridges Feet on ball rotations,  bridges, knees to chest  Supine stretches for low back and hips   STS  EVAL-07/22/23                                                                                                                                 PATIENT EDUCATION:  Education details: POC and HEP Person educated: Patient Education method: Explanation Education comprehension: verbalized understanding and returned demonstration  HOME EXERCISE PROGRAM: Access Code: [email or web address] URL: [email or web address] Date: 07/22/2023 Prepared by: Donavon Fudge  Exercises - Supine Posterior Pelvic Tilt  - 1 x daily - 7 x weekly - 2 sets - 10 reps - Supine Bridge  - 1 x daily - 7 x weekly - 2 sets - 10 reps - Supine Hip Adduction Isometric with Ball  - 1 x daily - 7 x weekly - 2 sets - 10 reps - Supine Lower Trunk Rotation  - 1 x daily - 7 x weekly - 2 sets - 10 reps - Cat Cow  - 1 x daily - 7 x weekly - 2 sets - 5 reps  ASSESSMENT:  CLINICAL IMPRESSION: Patient is a 38 y.o. female who was seen today for physical therapy evaluation and treatment for low back and hip pain. 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 Pt was coming for PT prior to delivery for these problems. She gave birth at the end of February. She is wearing a sacral  belt that she wears every day and most of the day. She feels it helps with stability. Pt presents with some weakness in her RLE and her core is weak. She will benefit from PT to work on core and pelvic floor strengthening to provide improved stability and mobility to decrease pain levels.   OBJECTIVE IMPAIRMENTS: decreased balance, decreased ROM, decreased strength, impaired flexibility, and pain.   ACTIVITY LIMITATIONS: carrying, lifting, bending, and squatting  PARTICIPATION LIMITATIONS: cleaning, occupation, and yard work  [facility] POTENTIAL: Good  CLINICAL DECISION MAKING: Stable/uncomplicated  EVALUATION COMPLEXITY: Low  GOALS: Goals reviewed with  patient? Yes  SHORT TERM GOALS: Target date: 08/26/23  Patient will be independent with initial HEP.  Baseline:  Goal status: INITIAL  2.  Patient will report centralization of radicular symptoms.  Baseline: pain radiates into RLE  Goal status: INITIAL   LONG TERM GOALS: Target date: 09/30/23  Patient will be independent with advanced/ongoing HEP to improve outcomes and carryover.  Baseline:  Goal status: INITIAL  2.  Patient will report 50-75% improvement in low back pain to improve QOL. (<3/10) Baseline: 7/10 at worst  Goal status: INITIAL  3.  Patient will demonstrate full pain free lumbar ROM to perform ADLs.   Baseline: see chart  Goal status: INITIAL  4.  Patient will demonstrate improved functional strength as demonstrated by 5xSTS <14s . Baseline: 22s Goal status: INITIAL  5.  Patient will demonstrate improved LE strength as demonstrated by 5/5 in all muscle groups . Baseline:  Goal status: INITIAL  PLAN:  PT FREQUENCY: 1-2x/week  PT DURATION: 10 weeks  PLANNED INTERVENTIONS: 97110-Therapeutic exercises, 97530- Therapeutic activity, 97112- Neuromuscular re-education, 97535- Self Care, 16109- Manual therapy, G0283- Electrical stimulation (unattended), Patient/Family education, Balance training, Stair training, Taping, Dry Needling, Joint mobilization, Spinal mobilization, Cryotherapy, and Moist heat.  PLAN FOR NEXT SESSION: low back and core stability, R hip stretching   **No traction, e-stim, vaso, iontoH. J. Heinz, PT 08/11/2023, 3:37 PM

## 2023-08-12 ENCOUNTER — Ambulatory Visit: Attending: Obstetrics and Gynecology

## 2023-08-12 DIAGNOSIS — N8189 Other female genital prolapse: Secondary | ICD-10-CM | POA: Insufficient documentation

## 2023-08-12 DIAGNOSIS — M5431 Sciatica, right side: Secondary | ICD-10-CM | POA: Insufficient documentation

## 2023-08-12 DIAGNOSIS — M5459 Other low back pain: Secondary | ICD-10-CM | POA: Diagnosis present

## 2023-08-12 DIAGNOSIS — M6281 Muscle weakness (generalized): Secondary | ICD-10-CM | POA: Diagnosis present

## 2023-08-20 ENCOUNTER — Encounter: Admitting: Physical Therapy

## 2023-08-20 ENCOUNTER — Ambulatory Visit

## 2023-08-27 ENCOUNTER — Ambulatory Visit

## 2023-08-27 DIAGNOSIS — M5431 Sciatica, right side: Secondary | ICD-10-CM

## 2023-08-27 DIAGNOSIS — M5459 Other low back pain: Secondary | ICD-10-CM

## 2023-08-27 DIAGNOSIS — M6281 Muscle weakness (generalized): Secondary | ICD-10-CM

## 2023-08-27 DIAGNOSIS — N8189 Other female genital prolapse: Secondary | ICD-10-CM

## 2023-08-27 NOTE — Therapy (Signed)
 OUTPATIENT PHYSICAL THERAPY THORACOLUMBAR TREATMENT   Patient Name: Chelsea Ballard MRN: [id number] DOB:Oct 13, 1985, 38 y.o., female Today's Date: 08/27/2023  END OF SESSION:  PT End of Session - 08/27/23 1105     Visit Number 3    Date for PT Re-Evaluation 09/30/23    Authorization Type [place] Medicaid    PT Start Time 1105    PT Stop Time 1145    PT Time Calculation (min) 40 min            Past Medical History:  Diagnosis Date   Gestational diabetes    Migraine    Past Surgical History:  Procedure Laterality Date   TONSILLECTOMY     TOOTH EXTRACTION  2017   Patient Active Problem List   Diagnosis Date Noted   Normal labor 04/23/2023   Fatigue 12/15/2020   Hypoglycemia 12/17/2019   Preterm premature rupture of membranes (PPROM) delivered, current hospitalization 09/27/2019   Abnormal glucose tolerance test (GTT) during pregnancy, antepartum 09/01/2019   Hemorrhoids 05/15/2010   Constipation 05/15/2010   Helicobacter pylori infection 03/26/2010   ACID REFLUX DISEASE 03/23/2010   FOLLICULITIS 03/23/2010   ACUTE PHARYNGITIS 04/06/2007   CONTACT DERMATITIS 04/06/2007    PCP: Olam Pinal  REFERRING PROVIDER: Rexene Hoit   REFERRING DIAG:  [phone] (ICD-10-CM) - Traumatic rupture of symphysis pubis, initial encounter  M54.30 (ICD-10-CM) - Sciatica, unspecified side    Rationale for Evaluation and Treatment: Rehabilitation  THERAPY DIAG:  Muscle weakness (generalized)  Sciatica of right side  Other low back pain  Pelvic floor weakness  ONSET DATE: 04/23/23  SUBJECTIVE:                                                                                                                                                                                           SUBJECTIVE STATEMENT: Getting a little better, having some pressure in my knees today. My legs feel heavy.   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   PAIN:  Are you  having pain? Yes: NPRS scale: 7/10  Pain location: across low back and into R leg  Pain description: achy, burning, numbness in R leg Aggravating factors: standing, moving, fast walking  Relieving factors: therapy helped before my pregnancy so I asked them to send me back  PRECAUTIONS: None  RED FLAGS: None   WEIGHT BEARING RESTRICTIONS: No  FALLS:  Has patient fallen in last 6 months? No  LIVING ENVIRONMENT: Lives with: lives with their family Lives in: House/apartment Stairs: Yes: Internal: 14 steps; on right going up and External: 4 steps; can reach both Has following equipment at  home: None  OCCUPATION: CNA  PLOF: Independent  PATIENT GOALS: tried to get rid of pain, have better balance on the R side   NEXT MD VISIT:   OBJECTIVE:  Note: Objective measures were completed at Evaluation unless otherwise noted.  DIAGNOSTIC FINDINGS:  N/A  COGNITION: Overall cognitive status: Within functional limits for tasks assessed     SENSATION: WFL  MUSCLE LENGTH: Hamstrings: mild tightness in BLE   PALPATION: Some pain and TTP along lumbar spine, in R calf, and R hip musculature   LUMBAR ROM:   AROM eval  Flexion Almost touches toes, pain in R calf and a little in low back  Extension Pain at end range  Right lateral flexion Distal thigh  Left lateral flexion Distal thigh  Right rotation WNL  Left rotation WNL pain at end range   (Blank rows = not tested)  LOWER EXTREMITY ROM:  WFL BLE   LOWER EXTREMITY MMT:    MMT Right eval Left eval  Hip flexion 4- 4+  Hip extension    Hip abduction 4- 4+  Hip adduction    Hip internal rotation    Hip external rotation    Knee flexion 4- 4+  Knee extension 4- 4+  Ankle dorsiflexion    Ankle plantarflexion    Ankle inversion    Ankle eversion     (Blank rows = not tested)  LUMBAR SPECIAL TESTS:  Straight leg raise test: Positive, Slump test: Positive, and FABER test: Negative  FUNCTIONAL TESTS:  5 times sit  to stand: 22s with pain in back on R side    TREATMENT DATE:  08/27/23 NuStep L5x61mins  3 way hip with red band x10 AB isometric 2x10 AB roll up 2x10 Feet on ball rotations, knees to chest, small bridges Passive HS, LTR, glute, piriformis, SKTC, ITB stretching  blackTB ext 2x10  08/12/23 NuStep L5 x80mins  PPT x10 Bridges 2x10 Supine ball squeezes 2x10 Sidelying adduction (top leg on foam roller) x10  Supine stretches for low back and hips- HS, LTR, SKTC Feet on ball rotations, bridges, knees to chest x10 STS 2x10  EVAL-07/22/23                                                                                                                                 PATIENT EDUCATION:  Education details: POC and HEP Person educated: Patient Education method: Explanation Education comprehension: verbalized understanding and returned demonstration  HOME EXERCISE PROGRAM: Access Code: [email or web address] URL: [email or web address] Date: 07/22/2023 Prepared by: Almetta Fam  Exercises - Supine Posterior Pelvic Tilt  - 1 x daily - 7 x weekly - 2 sets - 10 reps - Supine Bridge  - 1 x daily - 7 x weekly - 2 sets - 10 reps - Supine Hip Adduction Isometric with Ball  - 1 x daily - 7 x weekly - 2 sets - 10 reps - Supine Lower Trunk Rotation  - 1 x  daily - 7 x weekly - 2 sets - 10 reps - Cat Cow  - 1 x daily - 7 x weekly - 2 sets - 5 reps  ASSESSMENT:  CLINICAL IMPRESSION: Patient returns with some ongoing pain in her legs. We focused mostly on hip and core strengthening today. Some muscle fatigue with 3 way kicks using red band.  Has a sharp pain in R glute with lower trunk rotations, cued not to rotate as far. Continue to address her pain and stability for pelvis. She states it feels good at end of visit today.    Patient is a 38 y.o. female who was seen today for physical therapy evaluation and treatment for low back and hip pain. She has a history of sustained rupture of symphysis  pubis and sciatic pain. Pt was coming for PT prior to delivery for these problems. She gave birth at the end of February. She is wearing a sacral belt that she wears every day and most of the day. She feels it helps with stability. Pt presents with some weakness in her RLE and her core is weak. She will benefit from PT to work on core and pelvic floor strengthening to provide improved stability and mobility to decrease pain levels.   OBJECTIVE IMPAIRMENTS: decreased balance, decreased ROM, decreased strength, impaired flexibility, and pain.   ACTIVITY LIMITATIONS: carrying, lifting, bending, and squatting  PARTICIPATION LIMITATIONS: cleaning, occupation, and yard work  [facility] POTENTIAL: Good  CLINICAL DECISION MAKING: Stable/uncomplicated  EVALUATION COMPLEXITY: Low  GOALS: Goals reviewed with patient? Yes  SHORT TERM GOALS: Target date: 08/26/23  Patient will be independent with initial HEP.  Baseline:  Goal status: INITIAL  2.  Patient will report centralization of radicular symptoms.  Baseline: pain radiates into RLE  Goal status: INITIAL   LONG TERM GOALS: Target date: 09/30/23  Patient will be independent with advanced/ongoing HEP to improve outcomes and carryover.  Baseline:  Goal status: INITIAL  2.  Patient will report 50-75% improvement in low back pain to improve QOL. (<3/10) Baseline: 7/10 at worst  Goal status: INITIAL  3.  Patient will demonstrate full pain free lumbar ROM to perform ADLs.   Baseline: see chart  Goal status: INITIAL  4.  Patient will demonstrate improved functional strength as demonstrated by 5xSTS <14s . Baseline: 22s Goal status: INITIAL  5.  Patient will demonstrate improved LE strength as demonstrated by 5/5 in all muscle groups . Baseline:  Goal status: INITIAL  PLAN:  PT FREQUENCY: 1-2x/week  PT DURATION: 10 weeks  PLANNED INTERVENTIONS: 97110-Therapeutic exercises, 97530- Therapeutic activity, 97112- Neuromuscular re-education,  97535- Self Care, [id number]- Manual therapy, [id number]- Electrical stimulation (unattended), Patient/Family education, Balance training, Stair training, Taping, Dry Needling, Joint mobilization, Spinal mobilization, Cryotherapy, and Moist heat.  PLAN FOR NEXT SESSION: low back and core stability, R hip stretching   **No traction, e-stim, vaso, iontoH. J. Heinz, PT 08/27/2023, 11:45 AM

## 2023-09-10 NOTE — Therapy (Signed)
 OUTPATIENT PHYSICAL THERAPY THORACOLUMBAR TREATMENT   Patient Name: Chelsea Ballard MRN: [id number] DOB:16-Mar-1985, 38 y.o., female Today's Date: 09/10/2023  END OF SESSION:      Past Medical History:  Diagnosis Date   Gestational diabetes    Migraine    Past Surgical History:  Procedure Laterality Date   TONSILLECTOMY     TOOTH EXTRACTION  2017   Patient Active Problem List   Diagnosis Date Noted   Normal labor 04/23/2023   Fatigue 12/15/2020   Hypoglycemia 12/17/2019   Preterm premature rupture of membranes (PPROM) delivered, current hospitalization 09/27/2019   Abnormal glucose tolerance test (GTT) during pregnancy, antepartum 09/01/2019   Hemorrhoids 05/15/2010   Constipation 05/15/2010   Helicobacter pylori infection 03/26/2010   ACID REFLUX DISEASE 03/23/2010   FOLLICULITIS 03/23/2010   ACUTE PHARYNGITIS 04/06/2007   CONTACT DERMATITIS 04/06/2007    PCP: Olam Pinal  REFERRING PROVIDER: Rexene Hoit   REFERRING DIAG:  [phone] (ICD-10-CM) - Traumatic rupture of symphysis pubis, initial encounter  M54.30 (ICD-10-CM) - Sciatica, unspecified side    Rationale for Evaluation and Treatment: Rehabilitation  THERAPY DIAG:  No diagnosis found.  ONSET DATE: 04/23/23  SUBJECTIVE:                                                                                                                                                                                           SUBJECTIVE STATEMENT: Getting a little better, having some pressure in my knees today. My legs feel heavy.   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   PAIN:  Are you having pain? Yes: NPRS scale: 7/10  Pain location: across low back and into R leg  Pain description: achy, burning, numbness in R leg Aggravating factors: standing, moving, fast walking  Relieving factors: therapy helped before my pregnancy so I asked them to send me back  PRECAUTIONS: None  RED  FLAGS: None   WEIGHT BEARING RESTRICTIONS: No  FALLS:  Has patient fallen in last 6 months? No  LIVING ENVIRONMENT: Lives with: lives with their family Lives in: House/apartment Stairs: Yes: Internal: 14 steps; on right going up and External: 4 steps; can reach both Has following equipment at home: None  OCCUPATION: CNA  PLOF: Independent  PATIENT GOALS: tried to get rid of pain, have better balance on the R side   NEXT MD VISIT:   OBJECTIVE:  Note: Objective measures were completed at Evaluation unless otherwise noted.  DIAGNOSTIC FINDINGS:  N/A  COGNITION: Overall cognitive status: Within functional limits for tasks assessed     SENSATION: WFL  MUSCLE LENGTH: Hamstrings: mild tightness in BLE  PALPATION: Some pain and TTP along lumbar spine, in R calf, and R hip musculature   LUMBAR ROM:   AROM eval  Flexion Almost touches toes, pain in R calf and a little in low back  Extension Pain at end range  Right lateral flexion Distal thigh  Left lateral flexion Distal thigh  Right rotation WNL  Left rotation WNL pain at end range   (Blank rows = not tested)  LOWER EXTREMITY ROM:  WFL BLE   LOWER EXTREMITY MMT:    MMT Right eval Left eval  Hip flexion 4- 4+  Hip extension    Hip abduction 4- 4+  Hip adduction    Hip internal rotation    Hip external rotation    Knee flexion 4- 4+  Knee extension 4- 4+  Ankle dorsiflexion    Ankle plantarflexion    Ankle inversion    Ankle eversion     (Blank rows = not tested)  LUMBAR SPECIAL TESTS:  Straight leg raise test: Positive, Slump test: Positive, and FABER test: Negative  FUNCTIONAL TESTS:  5 times sit to stand: 22s with pain in back on R side    TREATMENT DATE:  09/11/23 Bike Resisted gait 2 way hip 5# abd and ext AR press Step ups Calf stretch Leg press Deadbug   08/27/23 NuStep L5x33mins  3 way hip with red band x10 AB isometric 2x10 AB roll up 2x10 Feet on ball rotations, knees to  chest, small bridges Passive HS, LTR, glute, piriformis, SKTC, ITB stretching  blackTB ext 2x10  08/12/23 NuStep L5 x56mins  PPT x10 Bridges 2x10 Supine ball squeezes 2x10 Sidelying adduction (top leg on foam roller) x10  Supine stretches for low back and hips- HS, LTR, SKTC Feet on ball rotations, bridges, knees to chest x10 STS 2x10  EVAL-07/22/23                                                                                                                                 PATIENT EDUCATION:  Education details: POC and HEP Person educated: Patient Education method: Explanation Education comprehension: verbalized understanding and returned demonstration  HOME EXERCISE PROGRAM: Access Code: [email or web address] URL: [email or web address] Date: 07/22/2023 Prepared by: Almetta Fam  Exercises - Supine Posterior Pelvic Tilt  - 1 x daily - 7 x weekly - 2 sets - 10 reps - Supine Bridge  - 1 x daily - 7 x weekly - 2 sets - 10 reps - Supine Hip Adduction Isometric with Ball  - 1 x daily - 7 x weekly - 2 sets - 10 reps - Supine Lower Trunk Rotation  - 1 x daily - 7 x weekly - 2 sets - 10 reps - Cat Cow  - 1 x daily - 7 x weekly - 2 sets - 5 reps  ASSESSMENT:  CLINICAL IMPRESSION: Patient returns with some ongoing pain in her legs. We focused mostly on hip and core strengthening today. Some  muscle fatigue with 3 way kicks using red band.  Has a sharp pain in R glute with lower trunk rotations, cued not to rotate as far. Continue to address her pain and stability for pelvis. She states it feels good at end of visit today.    Patient is a 38 y.o. female who was seen today for physical therapy evaluation and treatment for low back and hip pain. 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 Pt was coming for PT prior to delivery for these problems. She gave birth at the end of February. She is wearing a sacral belt that she wears every day and most of the  day. She feels it helps with stability. Pt presents with some weakness in her RLE and her core is weak. She will benefit from PT to work on core and pelvic floor strengthening to provide improved stability and mobility to decrease pain levels.   OBJECTIVE IMPAIRMENTS: decreased balance, decreased ROM, decreased strength, impaired flexibility, and pain.   ACTIVITY LIMITATIONS: carrying, lifting, bending, and squatting  PARTICIPATION LIMITATIONS: cleaning, occupation, and yard work  [facility] POTENTIAL: Good  CLINICAL DECISION MAKING: Stable/uncomplicated  EVALUATION COMPLEXITY: Low  GOALS: Goals reviewed with patient? Yes  SHORT TERM GOALS: Target date: 08/26/23  Patient will be independent with initial HEP.  Baseline:  Goal status: INITIAL  2.  Patient will report centralization of radicular symptoms.  Baseline: pain radiates into RLE  Goal status: INITIAL   LONG TERM GOALS: Target date: 09/30/23  Patient will be independent with advanced/ongoing HEP to improve outcomes and carryover.  Baseline:  Goal status: INITIAL  2.  Patient will report 50-75% improvement in low back pain to improve QOL. (<3/10) Baseline: 7/10 at worst  Goal status: INITIAL  3.  Patient will demonstrate full pain free lumbar ROM to perform ADLs.   Baseline: see chart  Goal status: INITIAL  4.  Patient will demonstrate improved functional strength as demonstrated by 5xSTS <14s . Baseline: 22s Goal status: INITIAL  5.  Patient will demonstrate improved LE strength as demonstrated by 5/5 in all muscle groups . Baseline:  Goal status: INITIAL  PLAN:  PT FREQUENCY: 1-2x/week  PT DURATION: 10 weeks  PLANNED INTERVENTIONS: 97110-Therapeutic exercises, 97530- Therapeutic activity, 97112- Neuromuscular re-education, 97535- Self Care, [id number]- Manual therapy, [id number]- Electrical stimulation (unattended), Patient/Family education, Balance training, Stair training, Taping, Dry Needling, Joint mobilization,  Spinal mobilization, Cryotherapy, and Moist heat.  PLAN FOR NEXT SESSION: low back and core stability, R hip stretching   **No traction, e-stim, vaso, iontoH. J. Heinz, PT 09/10/2023, 4:22 PM

## 2023-09-11 ENCOUNTER — Ambulatory Visit: Attending: Obstetrics and Gynecology

## 2023-09-11 DIAGNOSIS — M6281 Muscle weakness (generalized): Secondary | ICD-10-CM | POA: Diagnosis present

## 2023-09-11 DIAGNOSIS — M5431 Sciatica, right side: Secondary | ICD-10-CM | POA: Diagnosis present

## 2023-09-11 DIAGNOSIS — M5459 Other low back pain: Secondary | ICD-10-CM | POA: Diagnosis present

## 2023-09-11 DIAGNOSIS — N8189 Other female genital prolapse: Secondary | ICD-10-CM | POA: Insufficient documentation

## 2023-09-17 ENCOUNTER — Ambulatory Visit: Admitting: Physical Therapy

## 2023-09-17 ENCOUNTER — Encounter: Payer: Self-pay | Admitting: Physical Therapy

## 2023-09-17 DIAGNOSIS — M6281 Muscle weakness (generalized): Secondary | ICD-10-CM

## 2023-09-17 DIAGNOSIS — M5459 Other low back pain: Secondary | ICD-10-CM

## 2023-09-17 DIAGNOSIS — M5431 Sciatica, right side: Secondary | ICD-10-CM | POA: Diagnosis not present

## 2023-09-17 DIAGNOSIS — N8189 Other female genital prolapse: Secondary | ICD-10-CM

## 2023-09-17 NOTE — Therapy (Signed)
 OUTPATIENT PHYSICAL THERAPY THORACOLUMBAR TREATMENT   Patient Name: Chelsea Ballard MRN: [id number] DOB:1985-12-22, 38 y.o., female Today's Date: 09/17/2023  END OF SESSION:  PT End of Session - 09/17/23 1327     Visit Number 5    Date for PT Re-Evaluation 10/29/23    Authorization Type [employer]    PT Start Time 1158   pt late to appt   PT Stop Time 1227    PT Time Calculation (min) 29 min    Activity Tolerance Patient tolerated treatment well    Behavior During Therapy [facility] for tasks assessed/performed              Past Medical History:  Diagnosis Date   Gestational diabetes    Migraine    Past Surgical History:  Procedure Laterality Date   TONSILLECTOMY     TOOTH EXTRACTION  2017   Patient Active Problem List   Diagnosis Date Noted   Normal labor 04/23/2023   Fatigue 12/15/2020   Hypoglycemia 12/17/2019   Preterm premature rupture of membranes (PPROM) delivered, current hospitalization 09/27/2019   Abnormal glucose tolerance test (GTT) during pregnancy, antepartum 09/01/2019   Hemorrhoids 05/15/2010   Constipation 05/15/2010   Helicobacter pylori infection 03/26/2010   ACID REFLUX DISEASE 03/23/2010   FOLLICULITIS 03/23/2010   ACUTE PHARYNGITIS 04/06/2007   CONTACT DERMATITIS 04/06/2007    PCP: Olam Pinal  REFERRING PROVIDER: Rexene Hoit   REFERRING DIAG:  [phone] (ICD-10-CM) - Traumatic rupture of symphysis pubis, initial encounter  M54.30 (ICD-10-CM) - Sciatica, unspecified side    Rationale for Evaluation and Treatment: Rehabilitation  THERAPY DIAG:  Sciatica of right side  Other low back pain  Muscle weakness (generalized)  Pelvic floor weakness  ONSET DATE: 04/23/23  SUBJECTIVE:                                                                                                                                                                                           SUBJECTIVE STATEMENT:  Feeling better compared to last week. Feeling  like I'm at about 50-60%, still feel fairly weak and like I need more strength. Still having back pain too but it is getting better.  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   PAIN:  Are you having pain? Yes: NPRS scale: 3/10  Pain location: just in the back for now, not in RLE at this point  Pain description: sharp pain sometimes, often sore  Aggravating factors: walking- sometimes R LE wants to give out, sitting for a long time  Relieving factors: therapy pain medicine but  not on this anymore   PRECAUTIONS:  None  RED FLAGS: None   WEIGHT BEARING RESTRICTIONS: No  FALLS:  Has patient fallen in last 6 months? No  LIVING ENVIRONMENT: Lives with: lives with their family Lives in: House/apartment Stairs: Yes: Internal: 14 steps; on right going up and External: 4 steps; can reach both Has following equipment at home: None  OCCUPATION: CNA  PLOF: Independent  PATIENT GOALS: tried to get rid of pain, have better balance on the R side   NEXT MD VISIT:   OBJECTIVE:  Note: Objective measures were completed at Evaluation unless otherwise noted.  DIAGNOSTIC FINDINGS:  N/A  COGNITION: Overall cognitive status: Within functional limits for tasks assessed     SENSATION: WFL  MUSCLE LENGTH: Hamstrings: mild tightness in BLE   PALPATION: Some pain and TTP along lumbar spine, in R calf, and R hip musculature   LUMBAR ROM:   AROM eval 09/17/23  Flexion Almost touches toes, pain in R calf and a little in low back Almost to toes, limited by tight calves more than anything   Extension Pain at end range Upper glute pain at end range   Right lateral flexion Distal thigh WNL   Left lateral flexion Distal thigh WNL   Right rotation WNL   Left rotation WNL pain at end range    (Blank rows = not tested)  LOWER EXTREMITY ROM:  WFL BLE   LOWER EXTREMITY MMT:    MMT Right eval Left eval Right 09/17/23 Left 09/17/23  Hip flexion 4- 4+ 4 4+   Hip extension      Hip abduction 4- 4+ 3+ 4+  Hip adduction      Hip internal rotation      Hip external rotation      Knee flexion 4- 4+ 4 4+  Knee extension 4- 4+ 4 4+  Ankle dorsiflexion      Ankle plantarflexion      Ankle inversion      Ankle eversion       (Blank rows = not tested)  LUMBAR SPECIAL TESTS:  Straight leg raise test: Positive, Slump test: Positive, and FABER test: Negative  FUNCTIONAL TESTS:  5 times sit to stand: 22s with pain in back on R side; 09/17/23 18.3 seconds no UEs, no pain   TREATMENT DATE:   09/17/23  Modifed Oswestry 10/50  MMT, 5xSTS, lumbar ROM, goal review and education on progress with PT/POC moving forward   Nustep L5x6 minutes BLEs only    09/11/23 Bike L2 x46mins Calf stretch 15s x2 Resisted gait 20# 4 way x4 2 way hip 5# abd and ext x10 each side AR press 10# 2x10  Step ups 6                                                                                                                                PATIENT EDUCATION:  Education details: POC and HEP Person educated: Patient Education method: Explanation Education comprehension: verbalized  understanding and returned demonstration  HOME EXERCISE PROGRAM: Access Code: [email or web address] URL: [email or web address] Date: 07/22/2023 Prepared by: Almetta Fam  Exercises - Supine Posterior Pelvic Tilt  - 1 x daily - 7 x weekly - 2 sets - 10 reps - Supine Bridge  - 1 x daily - 7 x weekly - 2 sets - 10 reps - Supine Hip Adduction Isometric with Ball  - 1 x daily - 7 x weekly - 2 sets - 10 reps - Supine Lower Trunk Rotation  - 1 x daily - 7 x weekly - 2 sets - 10 reps - Cat Cow  - 1 x daily - 7 x weekly - 2 sets - 5 reps  ASSESSMENT:  CLINICAL IMPRESSION:  Focused on getting updated objective data today- she is making definite progress in objective measures and towards goals. Pt attendance has been limited but she has been compliant with appropriate HEP and has been  feeling better, happy to see this. Still having issues with fluctuating levels of back pain and ongoing R LE functional weakness. Would definitely benefit from continuation of skilled PT services.     EVAL: Patient is a 38 y.o. female who was seen today for physical therapy evaluation and treatment for low back and hip pain. 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 Pt was coming for PT prior to delivery for these problems. She gave birth at the end of February. She is wearing a sacral belt that she wears every day and most of the day. She feels it helps with stability. Pt presents with some weakness in her RLE and her core is weak. She will benefit from PT to work on core and pelvic floor strengthening to provide improved stability and mobility to decrease pain levels.   OBJECTIVE IMPAIRMENTS: decreased balance, decreased ROM, decreased strength, impaired flexibility, and pain.   ACTIVITY LIMITATIONS: carrying, lifting, bending, and squatting  PARTICIPATION LIMITATIONS: cleaning, occupation, and yard work  [facility] POTENTIAL: Good  CLINICAL DECISION MAKING: Stable/uncomplicated  EVALUATION COMPLEXITY: Low  GOALS: Goals reviewed with patient? Yes  SHORT TERM GOALS: Target date: 10/08/2023    Patient will be independent with initial HEP.  Baseline:  Goal status: MET 09/17/23  2.  Patient will report centralization of radicular symptoms.  Baseline: pain radiates into RLE  Goal status: ONGOING 09/17/23 none today/frequency of radicular pain improving    LONG TERM GOALS: Target date: 10/29/2023    Patient will be independent with advanced/ongoing HEP to improve outcomes and carryover.  Baseline:  Goal status: IN PROGRESS 09/17/23  2.  Patient will report 50-75% improvement in low back pain to improve QOL. (<3/10) Baseline: 7/10 at worst  Goal status: IN PROGRESS 09/17/23- can get to 7-8/10 at worst   3.  Patient will demonstrate full pain free lumbar  ROM to perform ADLs.   Baseline: see chart  Goal status: PARTIALLY MET 09/17/23  4.  Patient will demonstrate improved functional strength as demonstrated by 5xSTS <14s . Baseline: 22s Goal status: IN PROGRESS 09/17/23 see above   5.  Patient will demonstrate improved LE strength as demonstrated by 5/5 in all muscle groups . Baseline:  Goal status: IN PROGRESS 09/17/23  PLAN:  PT FREQUENCY: 1x/week  PT DURATION: 6 weeks  PLANNED INTERVENTIONS: 97750- Physical Performance Testing, 97110-Therapeutic exercises, 97530- Therapeutic activity, [id number]- Neuromuscular re-education, 97535- Self Care, [id number]- Manual therapy, and 97032- Electrical stimulation (manual).  PLAN FOR NEXT SESSION: low back and core stability, R hip stretching,  progress HEP   **No traction, e-stim, vaso, ionto, aquatic**  Josette Rough, PT, DPT 09/17/23 1:30 PM

## 2023-09-29 NOTE — Therapy (Signed)
 OUTPATIENT PHYSICAL THERAPY THORACOLUMBAR TREATMENT   Patient Name: Chelsea Ballard MRN: [id number] DOB:18-Aug-1985, 38 y.o., female Today's Date: 09/30/2023  END OF SESSION:  PT End of Session - 09/30/23 1238     Visit Number 6    Date for PT Re-Evaluation 10/29/23    Authorization Type [employer]    PT Start Time 1238    PT Stop Time 1315    PT Time Calculation (min) 37 min    Activity Tolerance Patient tolerated treatment well    Behavior During Therapy [facility] for tasks assessed/performed               Past Medical History:  Diagnosis Date   Gestational diabetes    Migraine    Past Surgical History:  Procedure Laterality Date   TONSILLECTOMY     TOOTH EXTRACTION  2017   Patient Active Problem List   Diagnosis Date Noted   Normal labor 04/23/2023   Fatigue 12/15/2020   Hypoglycemia 12/17/2019   Preterm premature rupture of membranes (PPROM) delivered, current hospitalization 09/27/2019   Abnormal glucose tolerance test (GTT) during pregnancy, antepartum 09/01/2019   Hemorrhoids 05/15/2010   Constipation 05/15/2010   Helicobacter pylori infection 03/26/2010   ACID REFLUX DISEASE 03/23/2010   FOLLICULITIS 03/23/2010   ACUTE PHARYNGITIS 04/06/2007   CONTACT DERMATITIS 04/06/2007    PCP: Olam Pinal  REFERRING PROVIDER: Rexene Hoit   REFERRING DIAG:  [phone] (ICD-10-CM) - Traumatic rupture of symphysis pubis, initial encounter  M54.30 (ICD-10-CM) - Sciatica, unspecified side    Rationale for Evaluation and Treatment: Rehabilitation  THERAPY DIAG:  Sciatica of right side  Other low back pain  Muscle weakness (generalized)  Pelvic floor weakness  ONSET DATE: 04/23/23  SUBJECTIVE:                                                                                                                                                                                           SUBJECTIVE STATEMENT: A little bit of pain in the leg. The back is getting better.    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   PAIN:  Are you having pain? Yes: NPRS scale: 3/10  Pain location: just in the back for now, not in RLE at this point  Pain description: sharp pain sometimes, often sore  Aggravating factors: walking- sometimes R LE wants to give out, sitting for a long time  Relieving factors: therapy pain medicine but  not on this anymore   PRECAUTIONS: None  RED FLAGS: None   WEIGHT BEARING RESTRICTIONS: No  FALLS:  Has patient fallen in last 6 months? No  LIVING ENVIRONMENT: Lives with: lives with their family Lives in: House/apartment Stairs: Yes: Internal: 14 steps; on right going up and External: 4 steps; can reach both Has following equipment at home: None  OCCUPATION: CNA  PLOF: Independent  PATIENT GOALS: tried to get rid of pain, have better balance on the R side   NEXT MD VISIT:   OBJECTIVE:  Note: Objective measures were completed at Evaluation unless otherwise noted.  DIAGNOSTIC FINDINGS:  N/A  COGNITION: Overall cognitive status: Within functional limits for tasks assessed     SENSATION: WFL  MUSCLE LENGTH: Hamstrings: mild tightness in BLE   PALPATION: Some pain and TTP along lumbar spine, in R calf, and R hip musculature   LUMBAR ROM:   AROM eval 09/17/23  Flexion Almost touches toes, pain in R calf and a little in low back Almost to toes, limited by tight calves more than anything   Extension Pain at end range Upper glute pain at end range   Right lateral flexion Distal thigh WNL   Left lateral flexion Distal thigh WNL   Right rotation WNL   Left rotation WNL pain at end range    (Blank rows = not tested)  LOWER EXTREMITY ROM:  WFL BLE   LOWER EXTREMITY MMT:    MMT Right eval Left eval Right 09/17/23 Left 09/17/23  Hip flexion 4- 4+ 4 4+  Hip extension      Hip abduction 4- 4+ 3+ 4+  Hip adduction      Hip internal rotation      Hip external rotation      Knee  flexion 4- 4+ 4 4+  Knee extension 4- 4+ 4 4+  Ankle dorsiflexion      Ankle plantarflexion      Ankle inversion      Ankle eversion       (Blank rows = not tested)  LUMBAR SPECIAL TESTS:  Straight leg raise test: Positive, Slump test: Positive, and FABER test: Negative  FUNCTIONAL TESTS:  5 times sit to stand: 22s with pain in back on R side; 09/17/23 18.3 seconds no UEs, no pain   TREATMENT DATE:  09/30/23 Bike L3x41mins  Seated row 20# 2x10 Lat pull down 20# 2x10 blackTB ext 2x10 Shoulder ext 5# 2x10 Calf stretch 2x15s Step ups 6  STS holding yellow ball 2x10   09/17/23  Modifed Oswestry 10/50  MMT, 5xSTS, lumbar ROM, goal review and education on progress with PT/POC moving forward   Nustep L5x6 minutes BLEs only    09/11/23 Bike L2 x78mins Calf stretch 15s x2 Resisted gait 20# 4 way x4 2 way hip 5# abd and ext x10 each side AR press 10# 2x10  Step ups 6                                                                                                                             PATIENT EDUCATION:  Education details: POC and HEP Person educated:  Patient Education method: Explanation Education comprehension: verbalized understanding and returned demonstration  HOME EXERCISE PROGRAM: Access Code: [email or web address] URL: [email or web address] Date: 07/22/2023 Prepared by: Almetta Fam  Exercises - Supine Posterior Pelvic Tilt  - 1 x daily - 7 x weekly - 2 sets - 10 reps - Supine Bridge  - 1 x daily - 7 x weekly - 2 sets - 10 reps - Supine Hip Adduction Isometric with Ball  - 1 x daily - 7 x weekly - 2 sets - 10 reps - Supine Lower Trunk Rotation  - 1 x daily - 7 x weekly - 2 sets - 10 reps - Cat Cow  - 1 x daily - 7 x weekly - 2 sets - 5 reps  ASSESSMENT:  CLINICAL IMPRESSION:  Focused on getting updated objective data today- she is making definite progress in objective measures and towards goals. Pt attendance has been limited but she has been  compliant with appropriate HEP and has been feeling better, happy to see this. Still having issues with fluctuating levels of back pain and ongoing R LE functional weakness. Would definitely benefit from continuation of skilled PT services.     EVAL: Patient is a 38 y.o. female who was seen today for physical therapy evaluation and treatment for low back and hip pain. 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 Pt was coming for PT prior to delivery for these problems. She gave birth at the end of February. She is wearing a sacral belt that she wears every day and most of the day. She feels it helps with stability. Pt presents with some weakness in her RLE and her core is weak. She will benefit from PT to work on core and pelvic floor strengthening to provide improved stability and mobility to decrease pain levels.   OBJECTIVE IMPAIRMENTS: decreased balance, decreased ROM, decreased strength, impaired flexibility, and pain.   ACTIVITY LIMITATIONS: carrying, lifting, bending, and squatting  PARTICIPATION LIMITATIONS: cleaning, occupation, and yard work  [facility] POTENTIAL: Good  CLINICAL DECISION MAKING: Stable/uncomplicated  EVALUATION COMPLEXITY: Low  GOALS: Goals reviewed with patient? Yes  SHORT TERM GOALS: Target date: 10/08/2023    Patient will be independent with initial HEP.  Baseline:  Goal status: MET 09/17/23  2.  Patient will report centralization of radicular symptoms.  Baseline: pain radiates into RLE  Goal status: ONGOING 09/17/23 none today/frequency of radicular pain improving    LONG TERM GOALS: Target date: 10/29/2023    Patient will be independent with advanced/ongoing HEP to improve outcomes and carryover.  Baseline:  Goal status: IN PROGRESS 09/17/23  2.  Patient will report 50-75% improvement in low back pain to improve QOL. (<3/10) Baseline: 7/10 at worst  Goal status: IN PROGRESS 09/17/23- can get to 7-8/10 at worst   3.   Patient will demonstrate full pain free lumbar ROM to perform ADLs.   Baseline: see chart  Goal status: PARTIALLY MET 09/17/23  4.  Patient will demonstrate improved functional strength as demonstrated by 5xSTS <14s . Baseline: 22s Goal status: IN PROGRESS 09/17/23 18.3  5.  Patient will demonstrate improved LE strength as demonstrated by 5/5 in all muscle groups . Baseline:  Goal status: IN PROGRESS 09/17/23  PLAN:  PT FREQUENCY: 1x/week  PT DURATION: 6 weeks  PLANNED INTERVENTIONS: 97750- Physical Performance Testing, 97110-Therapeutic exercises, 97530- Therapeutic activity, [id number]- Neuromuscular re-education, 97535- Self Care, [id number]- Manual therapy, and 97032- Electrical stimulation (manual).  PLAN FOR NEXT SESSION: low back and  core stability, R hip stretching, progress HEP   **No traction, e-stim, vaso, ionto, aquaticD.R. Horton, [employer], [place], DPT 09/30/23 1:30 PM

## 2023-09-30 ENCOUNTER — Ambulatory Visit

## 2023-09-30 DIAGNOSIS — M5459 Other low back pain: Secondary | ICD-10-CM

## 2023-09-30 DIAGNOSIS — M5431 Sciatica, right side: Secondary | ICD-10-CM | POA: Diagnosis not present

## 2023-09-30 DIAGNOSIS — N8189 Other female genital prolapse: Secondary | ICD-10-CM

## 2023-09-30 DIAGNOSIS — M6281 Muscle weakness (generalized): Secondary | ICD-10-CM

## 2023-10-08 NOTE — Therapy (Incomplete)
 OUTPATIENT PHYSICAL THERAPY THORACOLUMBAR TREATMENT   Patient Name: ROSALEIGH BRAZZEL MRN: [id number] DOB:[birth year], 38 y.o., female Today's Date: 10/08/2023  END OF SESSION:         Past Medical History:  Diagnosis Date   Gestational diabetes    Migraine    Past Surgical History:  Procedure Laterality Date   TONSILLECTOMY     TOOTH EXTRACTION  2017   Patient Active Problem List   Diagnosis Date Noted   Normal labor 04/23/2023   Fatigue 12/15/2020   Hypoglycemia 12/17/2019   Preterm premature rupture of membranes (PPROM) delivered, current hospitalization 09/27/2019   Abnormal glucose tolerance test (GTT) during pregnancy, antepartum 09/01/2019   Hemorrhoids 05/15/2010   Constipation 05/15/2010   Helicobacter pylori infection 03/26/2010   ACID REFLUX DISEASE 03/23/2010   FOLLICULITIS 03/23/2010   ACUTE PHARYNGITIS 04/06/2007   CONTACT DERMATITIS 04/06/2007    PCP: Olam Pinal  REFERRING PROVIDER: Rexene Hoit   REFERRING DIAG:  [phone] (ICD-10-CM) - Traumatic rupture of symphysis pubis, initial encounter  M54.30 (ICD-10-CM) - Sciatica, unspecified side    Rationale for Evaluation and Treatment: Rehabilitation  THERAPY DIAG:  No diagnosis found.  ONSET DATE: 04/23/23  SUBJECTIVE:                                                                                                                                                                                           SUBJECTIVE STATEMENT: A little bit of pain in the leg. The back is getting better.   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   PAIN:  Are you having pain? Yes: NPRS scale: 3/10  Pain location: just in the back for now, not in RLE at this point  Pain description: sharp pain sometimes, often sore  Aggravating factors: walking- sometimes R LE wants to give out, sitting for a long time  Relieving factors: therapy pain medicine but  not on this anymore    PRECAUTIONS: None  RED FLAGS: None   WEIGHT BEARING RESTRICTIONS: No  FALLS:  Has patient fallen in last 6 months? No  LIVING ENVIRONMENT: Lives with: lives with their family Lives in: House/apartment Stairs: Yes: Internal: 14 steps; on right going up and External: 4 steps; can reach both Has following equipment at home: None  OCCUPATION: CNA  PLOF: Independent  PATIENT GOALS: tried to get rid of pain, have better balance on the R side   NEXT MD VISIT:   OBJECTIVE:  Note: Objective measures were completed at Evaluation unless otherwise noted.  DIAGNOSTIC FINDINGS:  N/A  COGNITION: Overall cognitive status: Within functional limits for tasks assessed  SENSATION: WFL  MUSCLE LENGTH: Hamstrings: mild tightness in BLE   PALPATION: Some pain and TTP along lumbar spine, in R calf, and R hip musculature   LUMBAR ROM:   AROM eval 09/17/23  Flexion Almost touches toes, pain in R calf and a little in low back Almost to toes, limited by tight calves more than anything   Extension Pain at end range Upper glute pain at end range   Right lateral flexion Distal thigh WNL   Left lateral flexion Distal thigh WNL   Right rotation WNL   Left rotation WNL pain at end range    (Blank rows = not tested)  LOWER EXTREMITY ROM:  WFL BLE   LOWER EXTREMITY MMT:    MMT Right eval Left eval Right 09/17/23 Left 09/17/23  Hip flexion 4- 4+ 4 4+  Hip extension      Hip abduction 4- 4+ 3+ 4+  Hip adduction      Hip internal rotation      Hip external rotation      Knee flexion 4- 4+ 4 4+  Knee extension 4- 4+ 4 4+  Ankle dorsiflexion      Ankle plantarflexion      Ankle inversion      Ankle eversion       (Blank rows = not tested)  LUMBAR SPECIAL TESTS:  Straight leg raise test: Positive, Slump test: Positive, and FABER test: Negative  FUNCTIONAL TESTS:  5 times sit to stand: 22s with pain in back on R side; 09/17/23 18.3 seconds no UEs, no pain   TREATMENT DATE:   10/09/23 NuStep Shoulder ext AR press Seated row Lat pull down AB roll up Deadbug Double leg lifts  09/30/23 Bike L3x65mins  Seated row 20# 2x10 Lat pull down 20# 2x10 blackTB ext 2x10 Shoulder ext 5# 2x10 Calf stretch 2x15s Step ups 6  STS holding yellow ball 2x10   09/17/23  Modifed Oswestry 10/50  MMT, 5xSTS, lumbar ROM, goal review and education on progress with PT/POC moving forward   Nustep L5x6 minutes BLEs only    09/11/23 Bike L2 x42mins Calf stretch 15s x2 Resisted gait 20# 4 way x4 2 way hip 5# abd and ext x10 each side AR press 10# 2x10  Step ups 6                                                                                                                             PATIENT EDUCATION:  Education details: POC and HEP Person educated: Patient Education method: Explanation Education comprehension: verbalized understanding and returned demonstration  HOME EXERCISE PROGRAM: Access Code: [email or web address] URL: [email or web address] Date: 07/22/2023 Prepared by: Almetta Fam  Exercises - Supine Posterior Pelvic Tilt  - 1 x daily - 7 x weekly - 2 sets - 10 reps - Supine Bridge  - 1 x daily - 7 x weekly - 2 sets - 10 reps - Supine Hip Adduction Isometric with Mercer  -  1 x daily - 7 x weekly - 2 sets - 10 reps - Supine Lower Trunk Rotation  - 1 x daily - 7 x weekly - 2 sets - 10 reps - Cat Cow  - 1 x daily - 7 x weekly - 2 sets - 5 reps  ASSESSMENT:  CLINICAL IMPRESSION:  Focused on getting updated objective data today- she is making definite progress in objective measures and towards goals. Pt attendance has been limited but she has been compliant with appropriate HEP and has been feeling better, happy to see this. Still having issues with fluctuating levels of back pain and ongoing R LE functional weakness. Would definitely benefit from continuation of skilled PT services.     EVAL: Patient is a 38 y.o. female who was seen today for  physical therapy evaluation and treatment for low back and hip pain. 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 Pt was coming for PT prior to delivery for these problems. She gave birth at the end of February. She is wearing a sacral belt that she wears every day and most of the day. She feels it helps with stability. Pt presents with some weakness in her RLE and her core is weak. She will benefit from PT to work on core and pelvic floor strengthening to provide improved stability and mobility to decrease pain levels.   OBJECTIVE IMPAIRMENTS: decreased balance, decreased ROM, decreased strength, impaired flexibility, and pain.   ACTIVITY LIMITATIONS: carrying, lifting, bending, and squatting  PARTICIPATION LIMITATIONS: cleaning, occupation, and yard work  [facility] POTENTIAL: Good  CLINICAL DECISION MAKING: Stable/uncomplicated  EVALUATION COMPLEXITY: Low  GOALS: Goals reviewed with patient? Yes  SHORT TERM GOALS: Target date: 10/08/2023    Patient will be independent with initial HEP.  Baseline:  Goal status: MET 09/17/23  2.  Patient will report centralization of radicular symptoms.  Baseline: pain radiates into RLE  Goal status: ONGOING 09/17/23 none today/frequency of radicular pain improving    LONG TERM GOALS: Target date: 10/29/2023    Patient will be independent with advanced/ongoing HEP to improve outcomes and carryover.  Baseline:  Goal status: IN PROGRESS 09/17/23  2.  Patient will report 50-75% improvement in low back pain to improve QOL. (<3/10) Baseline: 7/10 at worst  Goal status: IN PROGRESS 09/17/23- can get to 7-8/10 at worst   3.  Patient will demonstrate full pain free lumbar ROM to perform ADLs.   Baseline: see chart  Goal status: PARTIALLY MET 09/17/23  4.  Patient will demonstrate improved functional strength as demonstrated by 5xSTS <14s . Baseline: 22s Goal status: IN PROGRESS 09/17/23 18.3  5.  Patient will demonstrate  improved LE strength as demonstrated by 5/5 in all muscle groups . Baseline:  Goal status: IN PROGRESS 09/17/23  PLAN:  PT FREQUENCY: 1x/week  PT DURATION: 6 weeks  PLANNED INTERVENTIONS: 97750- Physical Performance Testing, 97110-Therapeutic exercises, 97530- Therapeutic activity, [id number]- Neuromuscular re-education, 97535- Self Care, [id number]- Manual therapy, and 97032- Electrical stimulation (manual).  PLAN FOR NEXT SESSION: low back and core stability, R hip stretching, progress HEP   **No traction, e-stim, vaso, ionto, aquaticD.R. Horton, Inc, [place], DPT 10/08/23 8:13 AM

## 2023-10-09 ENCOUNTER — Ambulatory Visit

## 2023-10-16 ENCOUNTER — Ambulatory Visit

## 2023-10-27 ENCOUNTER — Ambulatory Visit: Attending: Obstetrics and Gynecology

## 2024-02-12 ENCOUNTER — Other Ambulatory Visit: Payer: Self-pay | Admitting: Physician Assistant

## 2024-02-12 DIAGNOSIS — R748 Abnormal levels of other serum enzymes: Secondary | ICD-10-CM

## 2024-02-27 ENCOUNTER — Inpatient Hospital Stay: Admission: RE | Admit: 2024-02-27 | Source: Ambulatory Visit

## 2024-03-17 ENCOUNTER — Other Ambulatory Visit (HOSPITAL_BASED_OUTPATIENT_CLINIC_OR_DEPARTMENT_OTHER): Payer: Self-pay | Admitting: Physician Assistant

## 2024-03-17 ENCOUNTER — Encounter (HOSPITAL_BASED_OUTPATIENT_CLINIC_OR_DEPARTMENT_OTHER): Payer: Self-pay | Admitting: Physician Assistant

## 2024-03-17 DIAGNOSIS — G8929 Other chronic pain: Secondary | ICD-10-CM

## 2024-03-17 DIAGNOSIS — M79604 Pain in right leg: Secondary | ICD-10-CM

## 2024-03-17 DIAGNOSIS — M5416 Radiculopathy, lumbar region: Secondary | ICD-10-CM

## 2024-03-24 ENCOUNTER — Ambulatory Visit
Admission: RE | Admit: 2024-03-24 | Discharge: 2024-03-24 | Disposition: A | Source: Ambulatory Visit | Attending: Physician Assistant | Admitting: Physician Assistant

## 2024-03-24 DIAGNOSIS — R748 Abnormal levels of other serum enzymes: Secondary | ICD-10-CM
